# Patient Record
Sex: Male | Born: 1950 | Race: Black or African American | Hispanic: No | Marital: Married | Smoking: Current every day smoker
Health system: Southern US, Community
[De-identification: ages and names within clinical notes are randomized; demographics above are authoritative.]

## PROBLEM LIST (undated history)

## (undated) DIAGNOSIS — F1010 Alcohol abuse, uncomplicated: Secondary | ICD-10-CM

## (undated) DIAGNOSIS — M549 Dorsalgia, unspecified: Secondary | ICD-10-CM

## (undated) DIAGNOSIS — Z5900 Homelessness unspecified: Secondary | ICD-10-CM

---

## 2003-08-07 ENCOUNTER — Emergency Department (HOSPITAL_COMMUNITY): Admission: EM | Admit: 2003-08-07 | Discharge: 2003-08-08 | Payer: Self-pay | Admitting: Emergency Medicine

## 2003-09-30 ENCOUNTER — Ambulatory Visit: Payer: Self-pay | Admitting: Family Medicine

## 2003-09-30 ENCOUNTER — Inpatient Hospital Stay (HOSPITAL_COMMUNITY): Admission: EM | Admit: 2003-09-30 | Discharge: 2003-10-02 | Payer: Self-pay | Admitting: Emergency Medicine

## 2004-10-11 ENCOUNTER — Emergency Department (HOSPITAL_COMMUNITY): Admission: EM | Admit: 2004-10-11 | Discharge: 2004-10-12 | Payer: Self-pay | Admitting: Emergency Medicine

## 2005-03-19 ENCOUNTER — Emergency Department (HOSPITAL_COMMUNITY): Admission: EM | Admit: 2005-03-19 | Discharge: 2005-03-19 | Payer: Self-pay | Admitting: Emergency Medicine

## 2005-04-05 ENCOUNTER — Emergency Department (HOSPITAL_COMMUNITY): Admission: EM | Admit: 2005-04-05 | Discharge: 2005-04-06 | Payer: Self-pay | Admitting: Emergency Medicine

## 2005-12-12 ENCOUNTER — Emergency Department (HOSPITAL_COMMUNITY): Admission: EM | Admit: 2005-12-12 | Discharge: 2005-12-13 | Payer: Self-pay | Admitting: Emergency Medicine

## 2006-02-10 ENCOUNTER — Emergency Department (HOSPITAL_COMMUNITY): Admission: EM | Admit: 2006-02-10 | Discharge: 2006-02-10 | Payer: Self-pay | Admitting: Emergency Medicine

## 2006-05-28 ENCOUNTER — Emergency Department (HOSPITAL_COMMUNITY): Admission: EM | Admit: 2006-05-28 | Discharge: 2006-05-29 | Payer: Self-pay | Admitting: Emergency Medicine

## 2006-07-22 ENCOUNTER — Emergency Department (HOSPITAL_COMMUNITY): Admission: EM | Admit: 2006-07-22 | Discharge: 2006-07-22 | Payer: Self-pay | Admitting: Emergency Medicine

## 2006-08-20 ENCOUNTER — Emergency Department (HOSPITAL_COMMUNITY): Admission: EM | Admit: 2006-08-20 | Discharge: 2006-08-21 | Payer: Self-pay | Admitting: Emergency Medicine

## 2007-05-05 ENCOUNTER — Emergency Department (HOSPITAL_COMMUNITY): Admission: EM | Admit: 2007-05-05 | Discharge: 2007-05-06 | Payer: Self-pay | Admitting: Emergency Medicine

## 2007-12-26 ENCOUNTER — Emergency Department (HOSPITAL_COMMUNITY): Admission: EM | Admit: 2007-12-26 | Discharge: 2007-12-26 | Payer: Self-pay | Admitting: Emergency Medicine

## 2008-04-06 ENCOUNTER — Emergency Department (HOSPITAL_COMMUNITY): Admission: EM | Admit: 2008-04-06 | Discharge: 2008-04-07 | Payer: Self-pay | Admitting: Emergency Medicine

## 2010-03-26 ENCOUNTER — Emergency Department (HOSPITAL_COMMUNITY)
Admission: EM | Admit: 2010-03-26 | Discharge: 2010-03-27 | Disposition: A | Discharge status: Home / Self Care | Payer: Self-pay | Attending: Emergency Medicine | Admitting: Emergency Medicine

## 2010-03-26 DIAGNOSIS — R4789 Other speech disturbances: Secondary | ICD-10-CM | POA: Insufficient documentation

## 2010-03-26 DIAGNOSIS — IMO0002 Reserved for concepts with insufficient information to code with codable children: Secondary | ICD-10-CM | POA: Insufficient documentation

## 2010-03-26 DIAGNOSIS — F1010 Alcohol abuse, uncomplicated: Secondary | ICD-10-CM | POA: Insufficient documentation

## 2010-04-17 LAB — POCT I-STAT, CHEM 8
BUN: 17 mg/dL (ref 6–23)
Calcium, Ion: 1.07 mmol/L — ABNORMAL LOW (ref 1.12–1.32)
Chloride: 104 mEq/L (ref 96–112)
Creatinine, Ser: 1.2 mg/dL (ref 0.4–1.5)
Glucose, Bld: 77 mg/dL (ref 70–99)
HCT: 42 % (ref 39.0–52.0)
Hemoglobin: 14.3 g/dL (ref 13.0–17.0)
Potassium: 3.6 mEq/L (ref 3.5–5.1)
Sodium: 137 mEq/L (ref 135–145)
TCO2: 23 mmol/L (ref 0–100)

## 2010-04-17 LAB — URINALYSIS, ROUTINE W REFLEX MICROSCOPIC
Bilirubin Urine: NEGATIVE
Glucose, UA: NEGATIVE mg/dL
Hgb urine dipstick: NEGATIVE
Ketones, ur: NEGATIVE mg/dL
Nitrite: NEGATIVE
Protein, ur: NEGATIVE mg/dL
Specific Gravity, Urine: 1.009 (ref 1.005–1.030)
Urobilinogen, UA: 1 mg/dL (ref 0.0–1.0)
pH: 6 (ref 5.0–8.0)

## 2010-04-17 LAB — GLUCOSE, CAPILLARY: Glucose-Capillary: 84 mg/dL (ref 70–99)

## 2010-05-24 NOTE — Discharge Summary (Signed)
NAMEARSEN, MANGIONE NO.:  [id number]   MEDICAL RECORD NO.:  [id number]          PATIENT TYPE:  INP   LOCATION:  3004                         FACILITY:  [facility]   PHYSICIAN:  Madeleine B. Erenest Rasher, M.D.DATE OF BIRTH:  30-Sep-1950   DATE OF ADMISSION:  09/29/2003  DATE OF DISCHARGE:  10/02/2003                                 DISCHARGE SUMMARY   PRIMARY CARE PHYSICIAN:  Unassigned.   DISCHARGE DIAGNOSES:  1.  Polysubstance abuse.  2.  Left arm weakness.  3.  Anemia.  4.  Tobacco abuse.   DISCHARGE MEDICATIONS:  1.  Aspirin 325 mg p.o. daily.  2.  Nicotine patch 14 mg daily.   HOSPITAL COURSE:  #1 -  LEFT-SIDED WEAKNESS:  The patient is a 60 year old  African-American male who came to our emergency department by EMS when he  was found to be extremely intoxicated and left-sided weakness.  The patient  unable to give an exact onset of left-sided weakness, possibly a year,  possibly a week with increasing symptoms over the last few days.  On  admission, the patient had 3+/5 muscular strength on the left side, mild  facial droop, decreased grip strength.  On discharge, he has 4+/5 left-sided  weakness, facial droop resolved, decreased cerebellar function on the left  as demonstrated by finger-to-nose on examination.  The patient received  carotid Doppler's which showed no hemodynamically significant ICA stenosis  with mild heterogenous plaques bilaterally noted.  On admission, neuro was  consulted over the phone who stated stroke symptoms were chronicologically  outside of the stroke team window.  They recommended an aspirin.  CT scan of  the head showed no acute changes, no bleeds.  A physical therapy consult was  ordered.  Fasting lipid panel was ordered to assess the patient's risk.  Cholesterol was 186, HDL 33, LDL 124.  #2 -  POLYSUBSTANCE ABUSE:  The patient was intoxicated with an ETOH of 190  on admission.  Urine drug screen negative for cocaine, opiates,  benzo's, or  THC.  The patient was provided with counseling and information on drinking  cessation and substance abuse.  #3 -  ANEMIA:  The patient's B12 was 727 and within normal limits, folate  pending on discharge.  HIV negative.   CONDITION ON DISCHARGE:  Fair.   DISCHARGE DISPOSITION:  To home.   DISCHARGE INSTRUCTIONS:  Stop smoking and drinking.  Follow up with [facility], number provided.       MBV/MEDQ  D:  10/02/2003  T:  10/02/2003  Job:  [id number]

## 2010-05-24 NOTE — H&P (Signed)
Albert Bond, Albert Bond NO.:  [id number]   MEDICAL RECORD NO.:  [id number]          PATIENT TYPE:  INP   LOCATION:  1823                         FACILITY:  [facility]   PHYSICIAN:  Pearlean Brownie, M.D.DATE OF BIRTH:  [birth year]   DATE OF ADMISSION:  09/29/2003  DATE OF DISCHARGE:                                HISTORY & PHYSICAL   CHIEF COMPLAINT:  Left arm weakness and altered mental status with  intoxication.   HISTORY OF PRESENT ILLNESS:  This 60 year old African-American male with  alcohol abuse/dependence, who was here with left arm weakness per ED doctor  since 8:30 September 23 per patient weakness x 1 week.  History hard to  elicit secondary to patient's intoxications, questionable history of fall a  week ago, no rehab.   REVIEW OF SYSTEMS:  Negative for chest pain, headache, shortness of breath,  nausea, vomiting, diarrhea, constipation.  No blood in stool.  No fevers or  chills.   PAST MEDICAL HISTORY:  The patient denies any Past Medical History or  hospitalizations.  No  primary physician, no current medications, no  allergies.   FAMILY HISTORY:  Noncontributory.  The patient states parents died of old  age.   SOCIAL HISTORY:  Positive for ETOH.  The patient drinks all night long.  Per ED doctor, one-fifth Christiane Ha q.p.m.  Positive for tobacco, one pack  per day for several years.  Positive for crack use three days ago.  Lives at  [facility] with others.   PHYSICAL EXAMINATION:  VITAL SIGNS:  Temperature 97.8, heart rate 82,  respiratory rate 16, blood pressure 113/75, 97% on room air.  GENERAL:  The patient is disheveled.  Poor hygiene.  Malodorous, intoxicated  male in no acute distress.  HEENT:  Left brow has healed laceration.  Positive scleral icterus and  bilateral nystagmus.  Pupils equal, round, and reactive to light.  Questionable glaucoma.  Questionable left facial droop.  CARDIOVASCULAR;  Regular rate and rhythm.  Brisk  capillary refill, 2+  peripheral pulses.  RESPIRATORY:  Hard to examine secondary to intoxicated patient, but clear to  auscultation.  No wheezes appreciated.  Positive clubbing.  No cyanosis.  ABDOMEN:  Soft, nontender, nondistended.  Positive bowel sounds.  No liver  border palpated.  No organomegaly.  NEUROLOGIC:  Alert and oriented x 3, but patient clearly intoxicated.  Musculoskeletal left-side weakness in upper and lower extremities, 3+  muscular strength in upper and lower extremities, 4+ on the right.  SKIN:  No lesions appreciated.   LABORATORY AND X-RAY DATA:  White count 5.8, hemoglobin 10.5, platelets 140.  PT 13.0, INR 1.0, PTT 29.  UA negative.  Urine drug screen negative.  Alcohol level 190.  Hepatic panel negative.  AST 33, ALT 23, alkaline  phosphatase 61, total bilirubin 0.4, creatinine 1.1, direct bilirubin less  than 0.1.   IMPRESSION AND PLAN:  This is a 60 year old African-American male.   1.  Alcoholism.  The patient was intoxicated on admission.  The patient      reports drinking a liter q.p.m., questionable fall history.  Liver  function tests within normal limits.  PT/INR within normal limits.  No      palpable liver border.  Will admit patient and give multivitamins with      folate and thiamin.  DT precautions and Ativan protocol.  Will get      polysubstance abuse consult.  2.  Left-sided weakness.  The patient states weakness for one week,      questionable history of fall versus cerebrovascular accident versus      ethanol neuropathy versus Saturday night neuropathy, but head CT is      within normal limits.  Weakness is unilateral with 3+ muscular strength      on the left and left facial droop.  Unclear how long symptoms have      persisted, but definitely greater than 12 hours.  Per ED physician,      neurology was contacted, and the time frame for stroke intervention had      expired.  Thus, the recommendation was to give an aspirin 325 mg p.o.       daily.  We will reevaluate this week when patient is not intoxicated and      can cooperate more fully with physical exam.  3.  Disposition.  Care management consult ordered.       MBV/MEDQ  D:  09/30/2003  T:  09/30/2003  Job:  [id number]

## 2010-07-26 ENCOUNTER — Emergency Department (HOSPITAL_COMMUNITY)
Admission: EM | Admit: 2010-07-26 | Discharge: 2010-07-27 | Disposition: A | Discharge status: Home / Self Care | Payer: Self-pay | Attending: Emergency Medicine | Admitting: Emergency Medicine

## 2010-07-26 DIAGNOSIS — F1010 Alcohol abuse, uncomplicated: Secondary | ICD-10-CM | POA: Insufficient documentation

## 2010-07-26 LAB — GLUCOSE, CAPILLARY: Glucose-Capillary: 125 mg/dL — ABNORMAL HIGH (ref 70–99)

## 2010-10-01 LAB — ETHANOL
Alcohol, Ethyl (B): 137 — ABNORMAL HIGH
Alcohol, Ethyl (B): 324 — ABNORMAL HIGH

## 2010-10-21 LAB — ETHANOL: Alcohol, Ethyl (B): 277 — ABNORMAL HIGH

## 2010-10-21 LAB — BASIC METABOLIC PANEL
BUN: 5 — ABNORMAL LOW
CO2: 26
Calcium: 8.4
Chloride: 104
Creatinine, Ser: 0.79
GFR calc Af Amer: >60
GFR calc non Af Amer: >60
Glucose, Bld: 83
Potassium: 3.7
Sodium: 139

## 2013-06-27 ENCOUNTER — Emergency Department (HOSPITAL_COMMUNITY)
Admission: EM | Admit: 2013-06-27 | Discharge: 2013-06-27 | Disposition: A | Discharge status: Home / Self Care | Payer: Self-pay | Attending: Emergency Medicine | Admitting: Emergency Medicine

## 2013-06-27 ENCOUNTER — Encounter (HOSPITAL_COMMUNITY): Payer: Self-pay | Admitting: Emergency Medicine

## 2013-06-27 DIAGNOSIS — F1010 Alcohol abuse, uncomplicated: Secondary | ICD-10-CM | POA: Insufficient documentation

## 2013-06-27 DIAGNOSIS — F10920 Alcohol use, unspecified with intoxication, uncomplicated: Secondary | ICD-10-CM

## 2013-06-27 LAB — CBG MONITORING, ED: Glucose-Capillary: 91 mg/dL (ref 70–99)

## 2013-06-27 NOTE — Discharge Instructions (Signed)
Alcohol Intoxication °Alcohol intoxication occurs when the amount of alcohol that a person has consumed impairs his or her ability to mentally and physically function. Alcohol directly impairs the normal chemical activity of the brain. Drinking large amounts of alcohol can lead to changes in mental function and behavior, and it can cause many physical effects that can be harmful.  °Alcohol intoxication can range in severity from mild to very severe. Various factors can affect the level of intoxication that occurs, such as the person's age, gender, weight, frequency of alcohol consumption, and the presence of other medical conditions (such as diabetes, seizures, or heart conditions). Dangerous levels of alcohol intoxication may occur when people drink large amounts of alcohol in a short period (binge drinking). Alcohol can also be especially dangerous when combined with certain prescription medicines or "recreational" drugs. °SIGNS AND SYMPTOMS °Some common signs and symptoms of mild alcohol intoxication include: °· Loss of coordination. °· Changes in mood and behavior. °· Impaired judgment. °· Slurred speech. °As alcohol intoxication progresses to more severe levels, other signs and symptoms will appear. These may include: °· Vomiting. °· Confusion and impaired memory. °· Slowed breathing. °· Seizures. °· Loss of consciousness. °DIAGNOSIS  °Your health care provider will take a medical history and perform a physical exam. You will be asked about the amount and type of alcohol you have consumed. Blood tests will be done to measure the concentration of alcohol in your blood. In many places, your blood alcohol level must be lower than 80 mg/dL (0.08%) to legally drive. However, many dangerous effects of alcohol can occur at much lower levels.  °TREATMENT  °People with alcohol intoxication often do not require treatment. Most of the effects of alcohol intoxication are temporary, and they go away as the alcohol naturally  leaves the body. Your health care provider will monitor your condition until you are stable enough to go home. Fluids are sometimes given through an IV access tube to help prevent dehydration.  °HOME CARE INSTRUCTIONS °· Do not drive after drinking alcohol. °· Stay hydrated. Drink enough water and fluids to keep your urine clear or pale yellow. Avoid caffeine.   °· Only take over-the-counter or prescription medicines as directed by your health care provider.   °SEEK MEDICAL CARE IF:  °· You have persistent vomiting.   °· You do not feel better after a few days. °· You have frequent alcohol intoxication. Your health care provider can help determine if you should see a substance use treatment counselor. °SEEK IMMEDIATE MEDICAL CARE IF:  °· You become shaky or tremble when you try to stop drinking.   °· You shake uncontrollably (seizure).   °· You throw up (vomit) blood. This may be bright red or may look like black coffee grounds.   °· You have blood in your stool. This may be bright red or may appear as a black, tarry, bad smelling stool.   °· You become lightheaded or faint.   °MAKE SURE YOU:  °· Understand these instructions. °· Will watch your condition. °· Will get help right away if you are not doing well or get worse. °Document Released: 10/02/2004 Document Revised: 08/25/2012 Document Reviewed: 05/28/2012 °ExitCare® Patient Information ©2015 [employer]. This information is not intended to replace advice given to you by your health care provider. Make sure you discuss any questions you have with your health care provider. ° ° °Emergency Department Resource Guide °1) Find a Doctor and Pay Out of Pocket °Although you won't have to find out who is covered by your   insurance plan, it is a good idea to ask around and get recommendations. You will then need to call the office and see if the doctor you have chosen will accept you as a new patient and what types of options they offer for patients who are self-pay.  Some doctors offer discounts or will set up payment plans for their patients who do not have insurance, but you will need to ask so you aren't surprised when you get to your appointment. ° °2) Contact Your Local Health Department °Not all health departments have doctors that can see patients for sick visits, but many do, so it is worth a call to see if yours does. If you don't know where your local health department is, you can check in your phone book. The CDC also has a tool to help you locate your state's health department, and many state websites also have listings of all of their local health departments. ° °3) Find a Walk-in Clinic °If your illness is not likely to be very severe or complicated, you may want to try a walk in clinic. These are popping up all over the country in pharmacies, drugstores, and shopping centers. They're usually staffed by nurse practitioners or physician assistants that have been trained to treat common illnesses and complaints. They're usually fairly quick and inexpensive. However, if you have serious medical issues or chronic medical problems, these are probably not your best option. ° °No Primary Care Doctor: °- Call [facility] at  832-8000 - they can help you locate a primary care doctor that  accepts your insurance, provides certain services, etc. °- Physician Referral Service- 1-800-533-3463 ° °Chronic Pain Problems: °Organization         Address  Phone   Notes  °[facility]  (336) 297-2271 Patients need to be referred by their primary care doctor.  ° °Medication Assistance: °Organization         Address  Phone   Notes  °[place] [facility] [address] °[place], [place] [address] (336) 641-8030 --Must be a resident of [place] °-- Must have NO insurance coverage whatsoever (no Medicaid/ Medicare, etc.) °-- The pt. MUST have a primary care doctor that directs their care regularly and follows them in the  community °  °[facility]  (866) 331-1348   °[employer]  (888) 892-1162   ° °Agencies that provide inexpensive medical care: °Organization         Address  Phone   Notes  °[facility]  (336) 832-8035   °[facility]    (336) 832-7272   °[facility] [address] °[place], [place] [address] (336) 832-4777   °[facility] [address], °[place] (336) 271-4999   °[facility]    (336) 373-0678   °[facility]    (336) 272-1050   °[facility] ° [address], [place] Phone:  (336) 832-4444, Fax:  (336) 832-4440 Hours of Operation:  9 am - 6 pm, M-F.  Also accepts Medicaid/Medicare and self-pay.  °[facility] ° [address], [place] Phone: (336) 832-3150, Fax: (336) 832-3151. Hours of Operation:  8:30 am - 5:30 pm, M-F.  Also accepts Medicaid and self-pay.  °[facility] [place] [address], [place] Phone: (336) 878-6027   °[facility] [address], [place], [place] (336)723-1848, Ext. 123 Mondays &   Thursdays: 7-9 AM.  First 15 patients are seen on a first come, first serve basis. °  ° °Medicaid-accepting [place] Providers: ° °Organization         Address  Phone   Notes  °[facility] [address], [place] (336) 641-2100 Also accepts self-pay patients.  °[facility] [address], [place] ° (336) 856-9996   °[facility] [address], [place] (336) 288-8857   °[facility] [address] [place] [address], [place] (336) 299-7000   °Veita Bland [address], [place]  ° (336) 373-1557 Only accepts [facility] Access Medicaid patients after they have their name applied to their card.  ° °Self-Pay (no insurance) in [place]: ° °Organization         Address  Phone   Notes  °[facility] [address], [place] (336) 832-1970   °[facility] [address], [place] (336) 832-4400   ° [facility] ° [address], [place] (336) 992-4800   °[facility]/Dr. Osei-Bonsu ° [address] [place] [address], [place] or [address], [place] (336) 841-8500 Phone number for both [place] and [place] locations is the same.  °[facility] [address], [place] (336) 299-0000   °[facility] [address] [place] [address], [place] or [address] (336) 852-7530 °(336) 878-2260   °[facility] [address], [place] (336) 350-1642, phone; (336) 294-5005, fax Sees patients 1st and 3rd Saturday of every month.  Must not qualify for public or private insurance (i.e. Medicaid, Medicare, [place] Health Choice, Veterans' Benefits) • Household income should be no more than 200% of the poverty level •The clinic cannot treat you if you are pregnant or think you are pregnant • Sexually transmitted diseases are not treated at the clinic.  ° ° °Dental Care: °Organization         Address  Phone  Notes  °[facility] [address], [place] (336) 641-6152 Accepts children up to age 21 who are enrolled in Medicaid or [facility] Health Choice; pregnant women with a Medicaid card; and children who have applied for Medicaid or [place] Health Choice, but were declined, whose parents can pay a reduced fee at time of service.  °[facility] [place]  [address], [place] (336) 641-7733 Accepts children up to age 21 who are enrolled in Medicaid or [place] Health Choice; pregnant women with a Medicaid card; and children who have applied for Medicaid or [place] Health Choice, but were declined, whose parents can pay a reduced fee at time of service.  °[facility] ° [address],  (336) 641-4533 Patients are seen by appointment only. Walk-ins are not accepted. [facility] will see patients 18 years of age and older. °Monday - Tuesday (8am-5pm) °Most Wednesdays (8:30-5pm) °$30 per visit, cash only  °[facility] ° [address], [place] (336) 641-4533 Patients are seen by appointment only. Walk-ins are not accepted. [facility] will see patients 18 years of age and older. °One Wednesday Evening (Monthly: Volunteer Based).  $30 per visit, cash only  °[facility]  (919) 537-3737 for adults; Children under   age 4, call Graduate Pediatric Dentistry at (919) [phone]. Children aged 4-14, please call (919) [phone] to request a pediatric application. ° Dental services are provided in all areas of dental care including fillings, crowns and bridges, complete and partial dentures, implants, gum treatment, root canals, and extractions. Preventive care is also provided. Treatment is provided to both adults and children. °Patients are selected via a lottery and there is often a waiting list. °  °[facility] [address], °[place] ° (336) 763-8833 [email or web address] °  °[facility] [address], [place], [place] (336)723-1848, Ext. 123 Second and Fourth Thursday of each month, opens at 6:30 AM; Clinic ends at 9 AM.  Patients are seen on a first-come first-served basis, and a limited number are seen during each clinic.  ° °[facility] ° [address], [place], [place] (336) 723-7904   Eligibility Requirements °You must have lived in [place], [place], or [place] counties for at least the last three months. °  You cannot be eligible for state or federal sponsored healthcare insurance, including Veterans Administration, Medicaid, or Medicare. °  You generally cannot be eligible for healthcare insurance through your employer.  °  How to apply: °Eligibility screenings are held every  Tuesday and Wednesday afternoon from 1:00 pm until 4:00 pm. You do not need an appointment for the interview!  °[facility] [address], [place], [place] 336-631-2330   °[facility]  336-342-8273   °[facility]  336-703-3100   °[facility]  336-570-6415   ° °Behavioral Health Resources in the Community: °Intensive Outpatient Programs °Organization         Address  Phone  Notes  °[facility] [address], [place], [place] 336-878-6098   °[facility] [address], [place], [place] 336-832-9800   °[facility] [address], [place], [place] ° 336-882-2125   °[facility] [address],  °[place], [place] 1-800-853-5163 or 336-641-4981   °Substance Abuse Resources °Organization         Address  Phone  Notes  °[facility]  336-882-2125   °[facility]  336-784-9470   °[facility]  336-285-9073   °[facility]  336-845-3988   °Residential & Outpatient Substance Abuse Program  1-800-659-3381   °Psychological Services °Organization         Address  Phone  Notes  °[facility]  336- 832-9600   °[facility]  336- 378-7881   °[facility] [address], [place] 1-800-853-5163 or 336-641-4981   ° °Mobile Crisis Teams °Organization         Address  Phone  Notes  °[facility], Mobile Crisis Care Unit  1-877-626-1772   °Assertive °[facility] ° [address] [place], [place] 336-834-9664   °Sharon DeEsch [address] °[place] 336-554-5454   ° °Self-Help/Support Groups °Organization         Address  Phone             Notes  °[facility]  - variety of support groups  336- 373-1402 Call for more information  °Narcotics Anonymous (NA), [facility] [address], °[place]  2 meetings at this location   ° °Residential Treatment Programs °Organization         Address  Phone  Notes  °[facility] [address],    °[place]  1-866-801-8205   °[facility] ° [address], [place], [place] 704-293-8524   °[facility] [address], [place] 336-845-3988 Admissions: 8am-3pm M-F  °[facility] [address]    °[place], [place] 336-841-1104   °[facility] [address], [place], [place] 336-379-7146   °[facility] [address]  °[place], [place] 336-285-9073   °[facility] Intensive Outpatient [address], [place], [place] 336-852-3033   °[facility] [address]  °[place], [place] 1-877-615-2722 or 336-784-9470   °[facility] ([facility] [address] [place], [place] 336-227-7417 Accepts Medicaid  °[facility] [address]  °[place] 1-800-659-3381 Substance Abuse/Addiction Treatment  ° °[place] Behavioral Health Resources °Organization         Address  Phone  Notes  °[facility]  (888) 581-9988   °Julie Brannon, PhD [address] [place], [place]   (336) 349-5553 or (336) 951-0000   °[facility]   [address] °[place], [place] (336) 349-4454   °[facility] [address], [place], [place] (336) 342-8316 Insurance/Medicaid/sponsorship through [facility]  °[facility] [address]                                    [place], [place] (336) 342-8316 Therapy/tele-psych/case  °[facility] [address]  ° [place], [place] (336) 349-2233    °Dr. Arfeen  (336) 349-4544   °[facility] [place]  [employer] [place] [facility] 1) [address],  °2) [address], [place] °3)  [address], [place] (336) 349-3220 °(336) 342-7768 ° °(336) 342-8140   °[place] [facility] (336) 342-1394 or (336) 342-3537 (After  Hours)    ° ° ° °

## 2013-06-27 NOTE — ED Notes (Signed)
[facility] Expected date:  Expected time:  Means of arrival:  Comments: EMS- ETOH UNABLE TO AMBULATE

## 2013-06-27 NOTE — ED Notes (Addendum)
Per EMS pt got citation for sleeping in no trespassing zone, was belligerent with police, too intoxicated with ETOH to walk and so cannot go with police. Pt states he had a small amount of gin and juice, part of a bottle of wine, and one blunt of marijuana today.

## 2013-06-27 NOTE — ED Provider Notes (Signed)
CSN: [id number]     Arrival date & time 06/27/13  1217 History   First MD Initiated Contact with Patient 06/27/13 1253     Chief Complaint  Patient presents with  . Alcohol Intoxication     (Consider location/radiation/quality/duration/timing/severity/associated sxs/prior Treatment) HPI Comments: 63 year old male presenting with acute alcohol intoxication. He states he drank too much this morning. He was drinking gin and juice. He was found asleep in a mattress dressings by police. He was too intoxicated to go with them, so he was brought to the emergency department.  Patient is a 63 y.o. male presenting with intoxication.  Alcohol Intoxication This is a recurrent problem. Episode onset: Today. The problem occurs constantly. The problem has not changed since onset.Associated symptoms comments: No headache, no nausea, no vomiting, no trauma.. Nothing aggravates the symptoms. Nothing relieves the symptoms.    History reviewed. No pertinent past medical history. History reviewed. No pertinent past surgical history. No family history on file. History  Substance Use Topics  . Smoking status: Not on file  . Smokeless tobacco: Not on file  . Alcohol Use: Yes     Comment: Heavily    Review of Systems  All other systems reviewed and are negative.     Allergies  Review of patient's allergies indicates no known allergies.  Home Medications   Prior to Admission medications   Not on File   BP 139/87  Pulse 71  Temp(Src) 97.8 F (36.6 C) (Oral)  Resp 18  SpO2 100% Physical Exam  Nursing note and vitals reviewed. Constitutional: He is oriented to person, place, and time. He appears well-developed and well-nourished. No distress.  HENT:  Head: Normocephalic and atraumatic.  Eyes: Conjunctivae are normal. No scleral icterus.  Neck: Neck supple.  Cardiovascular: Normal rate and intact distal pulses.   Pulmonary/Chest: Effort normal. No stridor. No respiratory distress.   Abdominal: Normal appearance. He exhibits no distension. There is no tenderness. There is no rebound and no guarding.  Neurological: He is alert and oriented to person, place, and time.  Skin: Skin is warm and dry. No rash noted.  Psychiatric: He has a normal mood and affect. His behavior is normal.    ED Course  Procedures (including critical care time) Labs Review Labs Reviewed  CBG MONITORING, ED    Imaging Review No results found.   EKG Interpretation None      MDM   Final diagnoses:  Acute alcohol intoxication, uncomplicated    63 year old male presenting with acute alcohol intoxication. Sleeping comfortably now. Vitals stable, nontoxic appearance. He endorses drinking gin. He denies trauma. He has no other acute complaints.  16:00 - patient still unable to ambulate secondary to his alcohol intoxication.    Candyce ChurnJohn David Dovey Fatzinger III, MD 06/30/13 [phone]

## 2013-06-27 NOTE — ED Provider Notes (Signed)
6:52 PM Pt ambulatory. Dc home.   Lyanne CoKevin M Pavlos Yon, MD 06/27/13 [phone]

## 2013-08-24 ENCOUNTER — Emergency Department (HOSPITAL_COMMUNITY): Payer: Medicaid Other

## 2013-08-24 ENCOUNTER — Inpatient Hospital Stay (HOSPITAL_COMMUNITY)
Admission: EM | Admit: 2013-08-24 | Discharge: 2013-09-02 | DRG: 473 | Disposition: A | Discharge status: Home Health | Payer: Medicaid Other | Attending: Neurosurgery | Admitting: Neurosurgery

## 2013-08-24 ENCOUNTER — Inpatient Hospital Stay (HOSPITAL_COMMUNITY): Payer: Medicaid Other

## 2013-08-24 ENCOUNTER — Encounter (HOSPITAL_COMMUNITY): Payer: Self-pay | Admitting: Emergency Medicine

## 2013-08-24 DIAGNOSIS — M4712 Other spondylosis with myelopathy, cervical region: Secondary | ICD-10-CM | POA: Diagnosis present

## 2013-08-24 DIAGNOSIS — Z8249 Family history of ischemic heart disease and other diseases of the circulatory system: Secondary | ICD-10-CM | POA: Diagnosis not present

## 2013-08-24 DIAGNOSIS — D696 Thrombocytopenia, unspecified: Secondary | ICD-10-CM | POA: Diagnosis present

## 2013-08-24 DIAGNOSIS — F17200 Nicotine dependence, unspecified, uncomplicated: Secondary | ICD-10-CM | POA: Diagnosis present

## 2013-08-24 DIAGNOSIS — Z8673 Personal history of transient ischemic attack (TIA), and cerebral infarction without residual deficits: Secondary | ICD-10-CM | POA: Diagnosis not present

## 2013-08-24 DIAGNOSIS — Z23 Encounter for immunization: Secondary | ICD-10-CM

## 2013-08-24 DIAGNOSIS — R531 Weakness: Secondary | ICD-10-CM

## 2013-08-24 DIAGNOSIS — F1010 Alcohol abuse, uncomplicated: Secondary | ICD-10-CM | POA: Diagnosis present

## 2013-08-24 DIAGNOSIS — R1313 Dysphagia, pharyngeal phase: Secondary | ICD-10-CM | POA: Diagnosis present

## 2013-08-24 DIAGNOSIS — F10920 Alcohol use, unspecified with intoxication, uncomplicated: Secondary | ICD-10-CM

## 2013-08-24 DIAGNOSIS — F1210 Cannabis abuse, uncomplicated: Secondary | ICD-10-CM | POA: Diagnosis present

## 2013-08-24 DIAGNOSIS — D649 Anemia, unspecified: Secondary | ICD-10-CM | POA: Diagnosis present

## 2013-08-24 DIAGNOSIS — M4802 Spinal stenosis, cervical region: Secondary | ICD-10-CM | POA: Diagnosis present

## 2013-08-24 DIAGNOSIS — W19XXXA Unspecified fall, initial encounter: Secondary | ICD-10-CM | POA: Diagnosis present

## 2013-08-24 DIAGNOSIS — M6281 Muscle weakness (generalized): Secondary | ICD-10-CM

## 2013-08-24 DIAGNOSIS — R55 Syncope and collapse: Secondary | ICD-10-CM

## 2013-08-24 HISTORY — DX: Dorsalgia, unspecified: M54.9

## 2013-08-24 LAB — COMPREHENSIVE METABOLIC PANEL
ALT: 8 U/L (ref 0–53)
ANION GAP: 17 — AB (ref 5–15)
AST: 23 U/L (ref 0–37)
Albumin: 4.2 g/dL (ref 3.5–5.2)
Alkaline Phosphatase: 57 U/L (ref 39–117)
BUN: 10 mg/dL (ref 6–23)
CALCIUM: 9.4 mg/dL (ref 8.4–10.5)
CO2: 23 meq/L (ref 19–32)
CREATININE: 0.87 mg/dL (ref 0.50–1.35)
Chloride: 100 mEq/L (ref 96–112)
GFR calc Af Amer: >90 mL/min (ref >90)
GFR calc non Af Amer: >90 mL/min (ref >90)
GLUCOSE: 98 mg/dL (ref 70–99)
Potassium: 4.4 mEq/L (ref 3.7–5.3)
Sodium: 140 mEq/L (ref 137–147)
Total Bilirubin: 0.5 mg/dL (ref 0.3–1.2)
Total Protein: 7.9 g/dL (ref 6.0–8.3)

## 2013-08-24 LAB — RAPID URINE DRUG SCREEN, HOSP PERFORMED
Amphetamines: NOT DETECTED
Barbiturates: NOT DETECTED
Benzodiazepines: NOT DETECTED
Cocaine: NOT DETECTED
OPIATES: NOT DETECTED
Tetrahydrocannabinol: NOT DETECTED

## 2013-08-24 LAB — CBC
HCT: 35.5 % — ABNORMAL LOW (ref 39.0–52.0)
HEMOGLOBIN: 12.1 g/dL — AB (ref 13.0–17.0)
MCH: 29.7 pg (ref 26.0–34.0)
MCHC: 34.1 g/dL (ref 30.0–36.0)
MCV: 87 fL (ref 78.0–100.0)
Platelets: 106 10*3/uL — ABNORMAL LOW (ref 150–400)
RBC: 4.08 MIL/uL — ABNORMAL LOW (ref 4.22–5.81)
RDW: 14.5 % (ref 11.5–15.5)
WBC: 5.4 10*3/uL (ref 4.0–10.5)

## 2013-08-24 LAB — APTT: APTT: 26 s (ref 24–37)

## 2013-08-24 LAB — TROPONIN I
Troponin I: <0.3 ng/mL (ref <0.30)
Troponin I: <0.3 ng/mL (ref <0.30)

## 2013-08-24 LAB — URINALYSIS, ROUTINE W REFLEX MICROSCOPIC
BILIRUBIN URINE: NEGATIVE
Glucose, UA: NEGATIVE mg/dL
Hgb urine dipstick: NEGATIVE
KETONES UR: NEGATIVE mg/dL
Leukocytes, UA: NEGATIVE
NITRITE: NEGATIVE
Protein, ur: NEGATIVE mg/dL
Specific Gravity, Urine: 1.004 — ABNORMAL LOW (ref 1.005–1.030)
UROBILINOGEN UA: 0.2 mg/dL (ref 0.0–1.0)
pH: 6 (ref 5.0–8.0)

## 2013-08-24 LAB — CBC WITH DIFFERENTIAL/PLATELET
BASOS ABS: 0 10*3/uL (ref 0.0–0.1)
Basophils Relative: 0 % (ref 0–1)
EOS PCT: 1 % (ref 0–5)
Eosinophils Absolute: 0 10*3/uL (ref 0.0–0.7)
HEMATOCRIT: 36.2 % — AB (ref 39.0–52.0)
HEMOGLOBIN: 12.3 g/dL — AB (ref 13.0–17.0)
LYMPHS ABS: 2 10*3/uL (ref 0.7–4.0)
LYMPHS PCT: 35 % (ref 12–46)
MCH: 30.3 pg (ref 26.0–34.0)
MCHC: 34 g/dL (ref 30.0–36.0)
MCV: 89.2 fL (ref 78.0–100.0)
MONO ABS: 0.3 10*3/uL (ref 0.1–1.0)
MONOS PCT: 5 % (ref 3–12)
Neutro Abs: 3.3 10*3/uL (ref 1.7–7.7)
Neutrophils Relative %: 59 % (ref 43–77)
Platelets: 105 10*3/uL — ABNORMAL LOW (ref 150–400)
RBC: 4.06 MIL/uL — ABNORMAL LOW (ref 4.22–5.81)
RDW: 14.4 % (ref 11.5–15.5)
WBC: 5.6 10*3/uL (ref 4.0–10.5)

## 2013-08-24 LAB — CREATININE, SERUM
Creatinine, Ser: 0.84 mg/dL (ref 0.50–1.35)
GFR calc Af Amer: >90 mL/min (ref >90)

## 2013-08-24 LAB — I-STAT TROPONIN, ED: TROPONIN I, POC: 0 ng/mL (ref 0.00–0.08)

## 2013-08-24 LAB — PROTIME-INR
INR: 1.03 (ref 0.00–1.49)
Prothrombin Time: 13.5 seconds (ref 11.6–15.2)

## 2013-08-24 LAB — I-STAT CHEM 8, ED
BUN: 8 mg/dL (ref 6–23)
CHLORIDE: 101 meq/L (ref 96–112)
Calcium, Ion: 1.09 mmol/L — ABNORMAL LOW (ref 1.13–1.30)
Creatinine, Ser: 1.2 mg/dL (ref 0.50–1.35)
GLUCOSE: 99 mg/dL (ref 70–99)
HEMATOCRIT: 42 % (ref 39.0–52.0)
Hemoglobin: 14.3 g/dL (ref 13.0–17.0)
POTASSIUM: 4.2 meq/L (ref 3.7–5.3)
Sodium: 139 mEq/L (ref 137–147)
TCO2: 24 mmol/L (ref 0–100)

## 2013-08-24 LAB — ETHANOL: ALCOHOL ETHYL (B): 213 mg/dL — AB (ref 0–11)

## 2013-08-24 LAB — CK: CK TOTAL: 392 U/L — AB (ref 7–232)

## 2013-08-24 MED ORDER — DEXAMETHASONE SODIUM PHOSPHATE 10 MG/ML IJ SOLN
10.0000 mg | Freq: Once | INTRAMUSCULAR | Status: AC
  Administered 2013-08-24: 10 mg via INTRAVENOUS
  Filled 2013-08-24: qty 1

## 2013-08-24 MED ORDER — SODIUM CHLORIDE 0.9 % IJ SOLN
3.0000 mL | INTRAMUSCULAR | Status: DC | PRN
  Administered 2013-08-27: 3 mL via INTRAVENOUS

## 2013-08-24 MED ORDER — ACETAMINOPHEN 650 MG RE SUPP: 650.0000 mg | Freq: Four times a day (QID) | RECTAL | Status: DC | PRN

## 2013-08-24 MED ORDER — ADULT MULTIVITAMIN W/MINERALS CH
1.0000 | ORAL_TABLET | Freq: Every day | ORAL | Status: DC
  Administered 2013-08-25 – 2013-09-02 (×8): 1 via ORAL
  Filled 2013-08-24 (×10): qty 1

## 2013-08-24 MED ORDER — LORAZEPAM 1 MG PO TABS
0.0000 mg | ORAL_TABLET | Freq: Two times a day (BID) | ORAL | Status: AC
  Administered 2013-08-26: 1 mg via ORAL
  Filled 2013-08-24: qty 1

## 2013-08-24 MED ORDER — SODIUM CHLORIDE 0.9 % IV SOLN
250.0000 mL | INTRAVENOUS | Status: DC | PRN
  Administered 2013-08-31: 250 mL via INTRAVENOUS

## 2013-08-24 MED ORDER — ENOXAPARIN SODIUM 40 MG/0.4ML ~~LOC~~ SOLN
40.0000 mg | SUBCUTANEOUS | Status: DC
  Administered 2013-08-25: 40 mg via SUBCUTANEOUS
  Filled 2013-08-24 (×2): qty 0.4

## 2013-08-24 MED ORDER — SODIUM CHLORIDE 0.9 % IJ SOLN
3.0000 mL | Freq: Two times a day (BID) | INTRAMUSCULAR | Status: DC
  Administered 2013-08-25: 3 mL via INTRAVENOUS

## 2013-08-24 MED ORDER — PNEUMOCOCCAL VAC POLYVALENT 25 MCG/0.5ML IJ INJ
0.5000 mL | INJECTION | INTRAMUSCULAR | Status: DC
Start: 2013-08-25 — End: 2013-08-26
  Filled 2013-08-24: qty 0.5

## 2013-08-24 MED ORDER — SODIUM CHLORIDE 0.9 % IV SOLN
INTRAVENOUS | Status: AC
  Administered 2013-08-24: 18:00:00 via INTRAVENOUS

## 2013-08-24 MED ORDER — FOLIC ACID 1 MG PO TABS
1.0000 mg | ORAL_TABLET | Freq: Every day | ORAL | Status: DC
  Administered 2013-08-25 – 2013-09-02 (×8): 1 mg via ORAL
  Filled 2013-08-24 (×10): qty 1

## 2013-08-24 MED ORDER — LORAZEPAM 2 MG/ML IJ SOLN: 1.0000 mg | Freq: Four times a day (QID) | INTRAMUSCULAR | Status: AC | PRN

## 2013-08-24 MED ORDER — THIAMINE HCL 100 MG/ML IJ SOLN
100.0000 mg | Freq: Every day | INTRAMUSCULAR | Status: DC
  Filled 2013-08-24 (×2): qty 1

## 2013-08-24 MED ORDER — VITAMIN B-1 100 MG PO TABS
100.0000 mg | ORAL_TABLET | Freq: Every day | ORAL | Status: DC
  Administered 2013-08-25 – 2013-09-02 (×8): 100 mg via ORAL
  Filled 2013-08-24 (×10): qty 1

## 2013-08-24 MED ORDER — ACETAMINOPHEN 325 MG PO TABS
650.0000 mg | ORAL_TABLET | Freq: Four times a day (QID) | ORAL | Status: DC | PRN
  Administered 2013-08-27 – 2013-09-01 (×4): 650 mg via ORAL
  Filled 2013-08-24 (×4): qty 2

## 2013-08-24 MED ORDER — NICOTINE 14 MG/24HR TD PT24
14.0000 mg | MEDICATED_PATCH | Freq: Every day | TRANSDERMAL | Status: DC
  Administered 2013-08-24 – 2013-09-02 (×9): 14 mg via TRANSDERMAL
  Filled 2013-08-24 (×10): qty 1

## 2013-08-24 MED ORDER — SODIUM CHLORIDE 0.9 % IJ SOLN
3.0000 mL | Freq: Two times a day (BID) | INTRAMUSCULAR | Status: DC
  Administered 2013-08-25 – 2013-09-02 (×11): 3 mL via INTRAVENOUS

## 2013-08-24 MED ORDER — LORAZEPAM 1 MG PO TABS
1.0000 mg | ORAL_TABLET | Freq: Four times a day (QID) | ORAL | Status: AC | PRN
  Administered 2013-08-25: 1 mg via ORAL
  Filled 2013-08-24: qty 1

## 2013-08-24 MED ORDER — LORAZEPAM 1 MG PO TABS
0.0000 mg | ORAL_TABLET | Freq: Four times a day (QID) | ORAL | Status: AC
  Administered 2013-08-24: 1 mg via ORAL
  Filled 2013-08-24: qty 1

## 2013-08-24 NOTE — Progress Notes (Signed)
Echo Lab  2D Echocardiogram completed.  Haston Casebolt L Lydiana Milley, RDCS 08/24/2013 10:41 AM

## 2013-08-24 NOTE — Progress Notes (Signed)
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 Per neurosurgery, plan for C3-4, C4-5 ACDF. Will await new consult post surgery.      Alvie HeidelbergFolan, Zackaria Burkey A 08/24/2013, 4:56 PM Alvie HeidelbergShauna Folan, PT, DPT [phone]

## 2013-08-24 NOTE — Care Management Note (Addendum)
  Page 2 of 2   09/02/2013     3:31:01 PM CARE MANAGEMENT NOTE 09/02/2013  Patient:  Albert Bond, Albert Bond   Account Number:  [id number]  Date Initiated:  08/24/2013  Documentation initiated by:  GRAVES-BIGELOW,BRENDA  Subjective/Objective Assessment:   Pt admitted for syncope and L sided weakness. MRI revealed severe canal stenosis and spinal cord compression. Plan for C3-4, C4-5 ACDF.     Action/Plan:   CM will continue to monitor.   Anticipated DC Date:  08/26/2013   Anticipated DC Plan:  [place]  CM consult      Choice offered to / List presented to:  C-1 Patient      DME agency  [facility] arranged  HH-5 SPEECH THERAPY  HH-2 PT      Status of service:  In process, will continue to follow Medicare Important Message given?  NO (If response is "NO", the following Medicare IM given date fields will be blank) Date Medicare IM given:   Medicare IM given by:   Date Additional Medicare IM given:   Additional Medicare IM given by:    Discharge Disposition:  [facility]  Per UR Regulation:  Reviewed for med. necessity/level of care/duration of stay  If discussed at [place] of Stay Meetings, dates discussed:    Comments:  09/02/13 [place], MSN, CM- Met with patient to discuss home health needs. Patient is agreeable to home health and is willing to have CM call to see what agency may be able to help him as a charity case.  Patient has orders for HHPT/SLP.  CM spoke with [name] with [employer] [facility], who states that financial paperwork for a charity case can be started on Monday.  CM spoke with patient's sister Albert Bond regarding [place]. Patient will be discharging home with his sister to [address] [place], [place] [address].  Contact number is [phone].     08-26-13 Rosa Sanchez, RN, BSN [phone] CM did place call to the [place]- received vm. [facility]  information placed on AVS for pt to call for hospital f/u. CM will continue to monitor for additional disposition needs.

## 2013-08-24 NOTE — Progress Notes (Signed)
[facility] PROGRESS NOTE  Albert Bond [facility] ZOX:[id number] DOB: 1950-12-08 DOA: 08/24/2013 PCP: No PCP Per Patient  Assessment/Plan: 1. L sided weakness secondary to severe cervical spine disease 1. Severe disease with spinal cord involvement 2. Neurosurgery following 3. Neurosurg recs noted for surgical decompression and fusion 2. Chronic Anemia of unclear etiology 1. Stable 2. Monitor for now 3. Thrombocytopenia 1. Suspect from ETOH intake (see below) 2. Follow for now 3. Stable thus far 4. Hx ETOH abuse 1. Pt reports drinking "all my life" 2. Last intake was "a fifth" one day prior to admit 3. On CIWA 5. DVT prophylaxis 1. Lovenox subq  Code Status: Full Family Communication: Pt in room, cousin over phone Disposition Plan: Pending   Consultants:  Neurosurgery  Procedures:    Antibiotics:   (indicate start date, and stop date if known)  HPI/Subjective: No complaints. No acute events noted  Objective: Filed Vitals:   08/24/13 0319 08/24/13 0400 08/24/13 0430 08/24/13 0559  BP:  116/77 105/71 149/93  Pulse:  66 71 78  Temp: 97.6 F (36.4 C)   98.2 F (36.8 C)  TempSrc:    Oral  Resp:  12 12 18   Height:    5\' 7"  (1.702 m)  Weight:    64.048 kg (141 lb 3.2 oz)  SpO2:  100% 99% 100%    Intake/Output Summary (Last 24 hours) at 08/24/13 1416 Last data filed at 08/24/13 0855  Gross per 24 hour  Intake    240 ml  Output   1400 ml  Net  -1160 ml   Filed Weights   08/24/13 0559  Weight: 64.048 kg (141 lb 3.2 oz)    Exam:   General:  Awake, in nad  Cardiovascular: regular, s1, s2  Respiratory: normal resp effort, no wheezing  Abdomen: soft, nondistended  Musculoskeletal: perfused,no clubbing   Data Reviewed: Basic Metabolic Panel:  Recent Labs Lab 08/24/13 0101 08/24/13 0110 08/24/13 0632  NA 140 139  --   K 4.4 4.2  --   CL 100 101  --   CO2 23  --   --   GLUCOSE 98 99  --   BUN 10 8  --   CREATININE 0.87 1.20 0.84   CALCIUM 9.4  --   --    Liver Function Tests:  Recent Labs Lab 08/24/13 0101  AST 23  ALT 8  ALKPHOS 57  BILITOT 0.5  PROT 7.9  ALBUMIN 4.2   No results found for this basename: LIPASE, AMYLASE,  in the last 168 hours No results found for this basename: AMMONIA,  in the last 168 hours CBC:  Recent Labs Lab 08/24/13 0101 08/24/13 0110 08/24/13 0632  WBC 5.6  --  5.4  NEUTROABS 3.3  --   --   HGB 12.3* 14.3 12.1*  HCT 36.2* 42.0 35.5*  MCV 89.2  --  87.0  PLT 105*  --  106*   Cardiac Enzymes:  Recent Labs Lab 08/24/13 0101 08/24/13 0751  CKTOTAL 392*  --   TROPONINI <0.30 <0.30   BNP (last 3 results) No results found for this basename: PROBNP,  in the last 8760 hours CBG: No results found for this basename: GLUCAP,  in the last 168 hours  No results found for this or any previous visit (from the past 240 hour(s)).   Studies: Ct Head Wo Contrast  08/24/2013   CLINICAL DATA:  Alcohol intoxication.  Unable to ambulate.  EXAM: CT HEAD WITHOUT CONTRAST  CT CERVICAL  SPINE WITHOUT CONTRAST  TECHNIQUE: Multidetector CT imaging of the head and cervical spine was performed following the standard protocol without intravenous contrast. Multiplanar CT image reconstructions of the cervical spine were also generated.  COMPARISON:  08/07/2003 head CT  FINDINGS: CT HEAD FINDINGS  Skull and Sinuses:Negative for fracture or destructive process. The mastoids, middle ears, and imaged paranasal sinuses are clear.  Orbits: No acute abnormality.  Brain: No evidence of acute abnormality, such as acute infarction, hemorrhage, hydrocephalus, or mass lesion/mass effect. There are new but discrete and likely remote lacunar infarcts involving the right caudate head and right caudate body/ corona radiata.  Brain atrophy which is generalized and progressive from 2005.  CT CERVICAL SPINE FINDINGS  No evidence of acute fracture or traumatic subluxation. No gross cervical canal hematoma or prevertebral  edema.  There is diffuse, advanced degenerative disc narrowing and endplate irregularity. When superimposed on a congenitally narrow spinal canal there is diffuse canal stenosis. Central disc herniation at C3-4 results in severe spinal canal stenosis with cord compression. Uncovertebral spurring is diffuse and bulky, with bilateral foraminal stenosis most progressed at C3-4 and C4-5. Degenerative ankylosis has formed across the C5-6 disc.  IMPRESSION: 1. No acute intracranial or cervical spine injury identified. 2. 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 3. Diffuse cervical canal stenosis, as above. Stenosis is severe at the level of C3-4, with cord compression. 4. Generalized brain atrophy, progressive from 2005.   Electronically Signed   By: Tiburcio PeaJonathan  Watts M.D.   On: 08/24/2013 01:48   Ct Cervical Spine Wo Contrast  08/24/2013   CLINICAL DATA:  Alcohol intoxication.  Unable to ambulate.  EXAM: CT HEAD WITHOUT CONTRAST  CT CERVICAL SPINE WITHOUT CONTRAST  TECHNIQUE: Multidetector CT imaging of the head and cervical spine was performed following the standard protocol without intravenous contrast. Multiplanar CT image reconstructions of the cervical spine were also generated.  COMPARISON:  08/07/2003 head CT  FINDINGS: CT HEAD FINDINGS  Skull and Sinuses:Negative for fracture or destructive process. The mastoids, middle ears, and imaged paranasal sinuses are clear.  Orbits: No acute abnormality.  Brain: No evidence of acute abnormality, such as acute infarction, hemorrhage, hydrocephalus, or mass lesion/mass effect. There are new but discrete and likely remote lacunar infarcts involving the right caudate head and right caudate body/ corona radiata.  Brain atrophy which is generalized and progressive from 2005.  CT CERVICAL SPINE FINDINGS  No evidence of acute fracture or traumatic subluxation. No gross cervical canal hematoma or prevertebral edema.  There is diffuse,  advanced degenerative disc narrowing and endplate irregularity. When superimposed on a congenitally narrow spinal canal there is diffuse canal stenosis. Central disc herniation at C3-4 results in severe spinal canal stenosis with cord compression. Uncovertebral spurring is diffuse and bulky, with bilateral foraminal stenosis most progressed at C3-4 and C4-5. Degenerative ankylosis has formed across the C5-6 disc.  IMPRESSION: 1. No acute intracranial or cervical spine injury identified. 2. 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 3. Diffuse cervical canal stenosis, as above. Stenosis is severe at the level of C3-4, with cord compression. 4. Generalized brain atrophy, progressive from 2005.   Electronically Signed   By: Tiburcio PeaJonathan  Watts M.D.   On: 08/24/2013 01:48   Mr Brain Wo Contrast  08/24/2013   ADDENDUM REPORT: 08/24/2013 10:47  ADDENDUM: Critical Value/emergent results were called by telephone at the time of interpretation on 08/24/2013 at 10:40 am to Dr. Rhona Leavenshiu who  verbally acknowledged these results.   Electronically Signed   By: Augusto Gamble M.D.   On: 08/24/2013 10:47   08/24/2013   CLINICAL DATA:  63 year old male with syncope and left side weakness. Advanced cervical spine degeneration with strong evidence of spinal stenosis by CT. Initial encounter.  EXAM: MRI HEAD WITHOUT CONTRAST  MRI CERVICAL SPINE WITHOUT CONTRAST  TECHNIQUE: Multiplanar, multiecho pulse sequences of the brain and surrounding structures, and cervical spine, to include the craniocervical junction and cervicothoracic junction, were obtained without intravenous contrast.  COMPARISON:  Head and cervical spine CT 0124 hr the same day.  FINDINGS: MRI HEAD FINDINGS  Cerebral volume is within normal limits for age. No restricted diffusion to suggest acute infarction. No midline shift, mass effect, evidence of mass lesion, ventriculomegaly, extra-axial collection or acute intracranial hemorrhage.  Cervicomedullary junction and pituitary are within normal limits.  Loss of the distal left vertebral artery flow void at the skullbase (series 5, image 3). Other Major intracranial vascular flow voids are preserved, with a degree of generalized intracranial artery dolichoectasia.  Chronic hemorrhagic lacunar infarct extending from the right corona radiata to the lentiform nuclei. Multiple chronic lacunar infarcts of the right caudate nucleus, the largest with chronic blood products. Additional chronic micro hemorrhage in the remaining bilateral deep gray matter nuclei. Chronic micro hemorrhages occasionally noted elsewhere in the brain. Additional nonspecific cerebral white matter T2 and FLAIR hyperintensity. No cortical encephalomalacia identified. Cerebellum within normal limits.  Visible internal auditory structures appear normal. Negative mastoids and paranasal sinuses except for minimal mucosal thickening in the right maxillary sinus. Visualized orbit soft tissues are within normal limits. Visualized scalp soft tissues are within normal limits. Hyperostosis of the calvarium with normal bone marrow signal.  MRI CERVICAL SPINE FINDINGS  Stable cervical vertebral height and alignment with straightening and mild reversal of lordosis. Sclerotic odontoid process on the comparison does demonstrate evidence of mild marrow edema, favor degenerative. There is also low level increased STIR signal between the odontoid tip an the [place] non. The tectorial membrane appears intact. No other abnormal ligamentous signal identified in the cervical spine.  Small volume layering secretions in the pharynx. The cervical left vertebral artery flow void is preserved, but appears diminutive to that on the right.  Abnormal cervical spinal cord signal from C3-C4 to C4-C5, and again at the C5-C6 level. Decreased cord volume, No evidence of hemorrhage within the spinal cord, and these changes are most compatible with myelomalacia.  C2-C3:  Moderate facet hypertrophy on the right. Moderate bilateral C3 foraminal stenosis.  C3-C4: Multifactorial severe spinal stenosis (AP thecal sac reduced to 3-4 millimeters) with spinal cord compression related to right eccentric bulky circumferential disc osteophyte complex. Majority of the posterior disc component is non calcified as seen on the comparison. Superimposed facet and uncovertebral hypertrophy with severe C4 biforaminal stenosis.  C4-C5: Multifactorial moderate to severe spinal stenosis (AP thecal sac reduced to 4 millimeters AP) with spinal cord compression and bulky circumferential disc osteophyte complex. Partially calcified posterior component of disc as seen on the comparison. Uncovertebral hypertrophy with severe C5 biforaminal stenosis.  C5-C6: Circumferential disc osteophyte complex, but the degree of spinal stenosis abates at this level, borderline to mild. Mild ligament flavum hypertrophy. Severe C6 biforaminal stenosis.  C6-C7: Circumferential disc osteophyte complex. Ligament flavum hypertrophy, but no spinal stenosis. Severe C7 biforaminal stenosis.  C7-T1: Trace anterolisthesis. Severe bilateral facet hypertrophy. Moderate ligament flavum hypertrophy. Uncovertebral hypertrophy. No spinal stenosis. Severe C8 biforaminal stenosis.  No upper thoracic spinal  stenosis.  IMPRESSION: 1. Severe degenerative cervical spinal stenosis at C3-C4 and C4-C5. Spinal cord compression at both levels with cord volume loss and abnormal signal most suggestive of myelomalacia. Borderline to mild spinal stenosis at C5-C6 were there is also evidence of abnormal cord signal favored to be chronic. No evidence of spinal cord hemorrhage. 2. Diffuse severe cervical foraminal stenosis. 3. No acute infarct. 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 4. Poor flow or occlusion of the distal left vertebral artery at the skullbase, suspect  chronic given the absence of acute posterior fossa signal changes.  Electronically Signed: By: Augusto Gamble M.D. On: 08/24/2013 10:26   Mr Cervical Spine Wo Contrast  08/24/2013   ADDENDUM REPORT: 08/24/2013 10:47  ADDENDUM: Critical Value/emergent results were called by telephone at the time of interpretation on 08/24/2013 at 10:40 am to Dr. Rhona Leavens who verbally acknowledged these results.   Electronically Signed   By: Augusto Gamble M.D.   On: 08/24/2013 10:47   08/24/2013   CLINICAL DATA:  63 year old male with syncope and left side weakness. Advanced cervical spine degeneration with strong evidence of spinal stenosis by CT. Initial encounter.  EXAM: MRI HEAD WITHOUT CONTRAST  MRI CERVICAL SPINE WITHOUT CONTRAST  TECHNIQUE: Multiplanar, multiecho pulse sequences of the brain and surrounding structures, and cervical spine, to include the craniocervical junction and cervicothoracic junction, were obtained without intravenous contrast.  COMPARISON:  Head and cervical spine CT 0124 hr the same day.  FINDINGS: MRI HEAD FINDINGS  Cerebral volume is within normal limits for age. No restricted diffusion to suggest acute infarction. No midline shift, mass effect, evidence of mass lesion, ventriculomegaly, extra-axial collection or acute intracranial hemorrhage. Cervicomedullary junction and pituitary are within normal limits.  Loss of the distal left vertebral artery flow void at the skullbase (series 5, image 3). Other Major intracranial vascular flow voids are preserved, with a degree of generalized intracranial artery dolichoectasia.  Chronic hemorrhagic lacunar infarct extending from the right corona radiata to the lentiform nuclei. Multiple chronic lacunar infarcts of the right caudate nucleus, the largest with chronic blood products. Additional chronic micro hemorrhage in the remaining bilateral deep gray matter nuclei. Chronic micro hemorrhages occasionally noted elsewhere in the brain. Additional nonspecific cerebral  white matter T2 and FLAIR hyperintensity. No cortical encephalomalacia identified. Cerebellum within normal limits.  Visible internal auditory structures appear normal. Negative mastoids and paranasal sinuses except for minimal mucosal thickening in the right maxillary sinus. Visualized orbit soft tissues are within normal limits. Visualized scalp soft tissues are within normal limits. Hyperostosis of the calvarium with normal bone marrow signal.  MRI CERVICAL SPINE FINDINGS  Stable cervical vertebral height and alignment with straightening and mild reversal of lordosis. Sclerotic odontoid process on the comparison does demonstrate evidence of mild marrow edema, favor degenerative. There is also low level increased STIR signal between the odontoid tip an the [place] non. The tectorial membrane appears intact. No other abnormal ligamentous signal identified in the cervical spine.  Small volume layering secretions in the pharynx. The cervical left vertebral artery flow void is preserved, but appears diminutive to that on the right.  Abnormal cervical spinal cord signal from C3-C4 to C4-C5, and again at the C5-C6 level. Decreased cord volume, No evidence of hemorrhage within the spinal cord, and these changes are most compatible with myelomalacia.  C2-C3: Moderate facet hypertrophy on the right. Moderate bilateral C3 foraminal stenosis.  C3-C4: Multifactorial severe  spinal stenosis (AP thecal sac reduced to 3-4 millimeters) with spinal cord compression related to right eccentric bulky circumferential disc osteophyte complex. Majority of the posterior disc component is non calcified as seen on the comparison. Superimposed facet and uncovertebral hypertrophy with severe C4 biforaminal stenosis.  C4-C5: Multifactorial moderate to severe spinal stenosis (AP thecal sac reduced to 4 millimeters AP) with spinal cord compression and bulky circumferential disc osteophyte complex. Partially calcified posterior component of disc as  seen on the comparison. Uncovertebral hypertrophy with severe C5 biforaminal stenosis.  C5-C6: Circumferential disc osteophyte complex, but the degree of spinal stenosis abates at this level, borderline to mild. Mild ligament flavum hypertrophy. Severe C6 biforaminal stenosis.  C6-C7: Circumferential disc osteophyte complex. Ligament flavum hypertrophy, but no spinal stenosis. Severe C7 biforaminal stenosis.  C7-T1: Trace anterolisthesis. Severe bilateral facet hypertrophy. Moderate ligament flavum hypertrophy. Uncovertebral hypertrophy. No spinal stenosis. Severe C8 biforaminal stenosis.  No upper thoracic spinal stenosis.  IMPRESSION: 1. Severe degenerative cervical spinal stenosis at C3-C4 and C4-C5. Spinal cord compression at both levels with cord volume loss and abnormal signal most suggestive of myelomalacia. Borderline to mild spinal stenosis at C5-C6 were there is also evidence of abnormal cord signal favored to be chronic. No evidence of spinal cord hemorrhage. 2. Diffuse severe cervical foraminal stenosis. 3. No acute infarct. 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 4. Poor flow or occlusion of the distal left vertebral artery at the skullbase, suspect chronic given the absence of acute posterior fossa signal changes.  Electronically Signed: By: Augusto Gamble M.D. On: 08/24/2013 10:26    Scheduled Meds: . enoxaparin (LOVENOX) injection  40 mg Subcutaneous Q24H  . folic acid  1 mg Oral Daily  . LORazepam  0-4 mg Oral Q6H   Followed by  . [START ON 08/26/2013] LORazepam  0-4 mg Oral Q12H  . multivitamin with minerals  1 tablet Oral Daily  . [START ON 08/25/2013] pneumococcal 23 valent vaccine  0.5 mL Intramuscular Tomorrow-1000  . sodium chloride  3 mL Intravenous Q12H  . sodium chloride  3 mL Intravenous Q12H  . thiamine  100 mg Oral Daily   Or  . thiamine  100 mg Intravenous Daily   Continuous Infusions: .  sodium chloride      Principal Problem:   Left-sided weakness Active Problems:   Syncope   Anemia  Time spent:  Layann Bluett K  [facility] Pager [phone]. If 7PM-7AM, please contact night-coverage at [email or web address], password [facility] 08/24/2013, 2:16 PM  LOS: 0 days

## 2013-08-24 NOTE — ED Provider Notes (Signed)
CSN: [id number]     Arrival date & time 08/24/13  0030 History   First MD Initiated Contact with Patient 08/24/13 0030     Chief Complaint  Patient presents with  . Alcohol Intoxication     (Consider location/radiation/quality/duration/timing/severity/associated sxs/prior Treatment) HPI Comments: Patient presents via EMS after found laying in a parking lot laying in wet clothes.  He endorses alcohol use tonight. He states he fell down and stumbled while drinking. Denies hitting head or losing consciousness. States he drinks every day. Denies any medical problems or other drug use. He complains of difficulty using his left hand and arm as well as his leg for the past 2 weeks. Denies any headache, chest pain, neck pain, abdominal pain. No fevers or vomiting. Patient is awake and alert. He is oriented to himself, the situation and time. He smells of alcohol.  The history is provided by the patient.    Past Medical History  Diagnosis Date  . Back pain    History reviewed. No pertinent past surgical history. Family History  Problem Relation Age of Onset  . CAD Mother   . CAD Father    History  Substance Use Topics  . Smoking status: Current Every Day Smoker -- 0.50 packs/day for 40 years    Types: Cigarettes  . Smokeless tobacco: Never Used  . Alcohol Use: Yes     Comment: Heavily    Review of Systems  Constitutional: Positive for activity change and appetite change. Negative for fever and fatigue.  HENT: Negative for congestion and rhinorrhea.   Eyes: Negative for visual disturbance.  Respiratory: Negative for cough, chest tightness and shortness of breath.   Cardiovascular: Negative for chest pain.  Gastrointestinal: Negative for nausea, vomiting and abdominal pain.  Genitourinary: Negative for dysuria and hematuria.  Musculoskeletal: Negative for back pain.  Neurological: Positive for facial asymmetry, weakness, light-headedness and numbness. Negative for dizziness and  headaches.  A complete 10 system review of systems was obtained and all systems are negative except as noted in the HPI and PMH.      Allergies  Review of patient's allergies indicates no known allergies.  Home Medications   Prior to Admission medications   Not on File   BP 149/93  Pulse 78  Temp(Src) 98.2 F (36.8 C) (Oral)  Resp 18  Ht 5\' 7"  (1.702 m)  Wt 141 lb 3.2 oz (64.048 kg)  BMI 22.11 kg/m2  SpO2 100% Physical Exam  Nursing note and vitals reviewed. Constitutional: He is oriented to person, place, and time. He appears well-developed and well-nourished. No distress.  Smells of alcohol  HENT:  Head: Normocephalic and atraumatic.  Mouth/Throat: Oropharynx is clear and moist. No oropharyngeal exudate.  Eyes: Conjunctivae and EOM are normal. Pupils are equal, round, and reactive to light.  Neck: Normal range of motion. Neck supple.  No C spine pain  Cardiovascular: Normal rate, regular rhythm, normal heart sounds and intact distal pulses.   No murmur heard. Pulmonary/Chest: Effort normal and breath sounds normal. No respiratory distress.  Abdominal: Soft. There is no tenderness. There is no rebound and no guarding.  Musculoskeletal: Normal range of motion. He exhibits no edema and no tenderness.  No T or L spine pain.  Neurological: He is alert and oriented to person, place, and time. No cranial nerve deficit. He exhibits normal muscle tone. Coordination normal.  Pronator drift on L. 4/5 strength in LUE and LLE.  Weak grip strength on L.  CN 2-12 intact  Skin: Skin is warm.  Psychiatric: He has a normal mood and affect. His behavior is normal.    ED Course  Procedures (including critical care time) Labs Review Labs Reviewed  CBC WITH DIFFERENTIAL - Abnormal; Notable for the following:    RBC 4.06 (*)    Hemoglobin 12.3 (*)    HCT 36.2 (*)    Platelets 105 (*)    All other components within normal limits  COMPREHENSIVE METABOLIC PANEL - Abnormal; Notable for  the following:    Anion gap 17 (*)    All other components within normal limits  ETHANOL - Abnormal; Notable for the following:    Alcohol, Ethyl (B) 213 (*)    All other components within normal limits  CK - Abnormal; Notable for the following:    Total CK 392 (*)    All other components within normal limits  URINALYSIS, ROUTINE W REFLEX MICROSCOPIC - Abnormal; Notable for the following:    Specific Gravity, Urine 1.004 (*)    All other components within normal limits  CBC - Abnormal; Notable for the following:    RBC 4.08 (*)    Hemoglobin 12.1 (*)    HCT 35.5 (*)    Platelets 106 (*)    All other components within normal limits  I-STAT CHEM 8, ED - Abnormal; Notable for the following:    Calcium, Ion 1.09 (*)    All other components within normal limits  PROTIME-INR  TROPONIN I  APTT  CREATININE, SERUM  TROPONIN I  URINE RAPID DRUG SCREEN (HOSP PERFORMED)  TROPONIN I  TROPONIN I  I-STAT TROPOININ, ED  I-STAT TROPOININ, ED    Imaging Review Ct Head Wo Contrast  08/24/2013   CLINICAL DATA:  Alcohol intoxication.  Unable to ambulate.  EXAM: CT HEAD WITHOUT CONTRAST  CT CERVICAL SPINE WITHOUT CONTRAST  TECHNIQUE: Multidetector CT imaging of the head and cervical spine was performed following the standard protocol without intravenous contrast. Multiplanar CT image reconstructions of the cervical spine were also generated.  COMPARISON:  08/07/2003 head CT  FINDINGS: CT HEAD FINDINGS  Skull and Sinuses:Negative for fracture or destructive process. The mastoids, middle ears, and imaged paranasal sinuses are clear.  Orbits: No acute abnormality.  Brain: No evidence of acute abnormality, such as acute infarction, hemorrhage, hydrocephalus, or mass lesion/mass effect. There are new but discrete and likely remote lacunar infarcts involving the right caudate head and right caudate body/ corona radiata.  Brain atrophy which is generalized and progressive from 2005.  CT CERVICAL SPINE FINDINGS   No evidence of acute fracture or traumatic subluxation. No gross cervical canal hematoma or prevertebral edema.  There is diffuse, advanced degenerative disc narrowing and endplate irregularity. When superimposed on a congenitally narrow spinal canal there is diffuse canal stenosis. Central disc herniation at C3-4 results in severe spinal canal stenosis with cord compression. Uncovertebral spurring is diffuse and bulky, with bilateral foraminal stenosis most progressed at C3-4 and C4-5. Degenerative ankylosis has formed across the C5-6 disc.  IMPRESSION: 1. No acute intracranial or cervical spine injury identified. 2. 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 3. Diffuse cervical canal stenosis, as above. Stenosis is severe at the level of C3-4, with cord compression. 4. Generalized brain atrophy, progressive from 2005.   Electronically Signed   By: Tiburcio Pea M.D.   On: 08/24/2013 01:48   Ct Cervical Spine Wo Contrast  08/24/2013   CLINICAL DATA:  Alcohol intoxication.  Unable to ambulate.  EXAM: CT HEAD WITHOUT CONTRAST  CT CERVICAL SPINE WITHOUT CONTRAST  TECHNIQUE: Multidetector CT imaging of the head and cervical spine was performed following the standard protocol without intravenous contrast. Multiplanar CT image reconstructions of the cervical spine were also generated.  COMPARISON:  08/07/2003 head CT  FINDINGS: CT HEAD FINDINGS  Skull and Sinuses:Negative for fracture or destructive process. The mastoids, middle ears, and imaged paranasal sinuses are clear.  Orbits: No acute abnormality.  Brain: No evidence of acute abnormality, such as acute infarction, hemorrhage, hydrocephalus, or mass lesion/mass effect. There are new but discrete and likely remote lacunar infarcts involving the right caudate head and right caudate body/ corona radiata.  Brain atrophy which is generalized and progressive from 2005.  CT CERVICAL SPINE FINDINGS  No evidence of acute  fracture or traumatic subluxation. No gross cervical canal hematoma or prevertebral edema.  There is diffuse, advanced degenerative disc narrowing and endplate irregularity. When superimposed on a congenitally narrow spinal canal there is diffuse canal stenosis. Central disc herniation at C3-4 results in severe spinal canal stenosis with cord compression. Uncovertebral spurring is diffuse and bulky, with bilateral foraminal stenosis most progressed at C3-4 and C4-5. Degenerative ankylosis has formed across the C5-6 disc.  IMPRESSION: 1. No acute intracranial or cervical spine injury identified. 2. 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 3. Diffuse cervical canal stenosis, as above. Stenosis is severe at the level of C3-4, with cord compression. 4. Generalized brain atrophy, progressive from 2005.   Electronically Signed   By: Tiburcio Pea M.D.   On: 08/24/2013 01:48     EKG Interpretation   Date/Time:  Wednesday August 24 2013 00:48:14 EDT Ventricular Rate:  66 PR Interval:  167 QRS Duration: 125 QT Interval:  461 QTC Calculation: 483 R Axis:   84 Text Interpretation:  Sinus rhythm Left ventricular hypertrophy Minimal ST  elevation, anterolateral leads Borderline prolonged QT interval Artifact  in lead(s) II III aVR aVF V1 V2 V3 V4 V5 V6 No previous ECGs available  Confirmed by Manus Gunning  MD, Donyale Berthold [phone]) on 08/24/2013 1:18:15 AM      MDM   Final diagnoses:  Left-sided weakness  Cervical stenosis of spine  Alcohol intoxication, uncomplicated   Intoxication without obvious injury. Patient found to have left-sided weakness which has been ongoing for at least 2 weeks.  Labs remarkable for alcohol intoxication. CT scan shows remote appearing lacunar infarct. No hemorrhage. There is diffuse cervical canal stenosis  Left-sided weakness is concerning for new infarct versus cervical canal stenosis.  Discussed with Dr. Selena Batten IV Decadron given  for possible cord compression. D/w Dr. Conchita Paris of neurosurgery who recommends MRI C-spine and agrees with IV Decadron. Will be admitted for stroke workup as well as MRI C spine.  Glynn Octave, MD 08/24/13 [phone]

## 2013-08-24 NOTE — ED Notes (Addendum)
Pt presents to ED via EMS for ETOH intoxication. Pt was found in a parking lot in rain unable to ambulate. Pt admit drinking one pint of liquor tonight.  Pt also has left hand in grip position and unable to open the hand, pt states onset 2 weeks. Pt alerts and oriented x4 at arrival. Per EMS, BP-150/90, RR-18, CBG-116. Pt came covered in stool, cleaned by ED staff.

## 2013-08-24 NOTE — ED Notes (Signed)
Patient transported to CT 

## 2013-08-24 NOTE — H&P (Signed)
Albert Bond is an 63 y.o. male.   Chief Complaint: syncope HPI: 63 yo male with c/o syncope, pt not certain how long he was out for.  This occurred while he was leaving his sons birthday.  Denies cp, palp, sob, lower ext edema, pt cant move left side quite as well.  Ed requests that pt be admitted for syncope and left sided weakness r/o CVA. And for evaluation of ? Cord compression on CT scan. ED states neurosurgery consulted , appreciate input.   Past Medical History  Diagnosis Date  . Back pain     History reviewed. No pertinent past surgical history.  Family History  Problem Relation Age of Onset  . CAD Mother   . CAD Father    Social History:  reports that he has been smoking Cigarettes.  He has a 20 pack-year smoking history. He does not have any smokeless tobacco history on file. He reports that he drinks alcohol. He reports that he uses illicit drugs (Marijuana).  Allergies: No Known Allergies   (Not in a hospital admission)  Results for orders placed during the hospital encounter of 08/24/13 (from the past 48 hour(s))  CBC WITH DIFFERENTIAL     Status: Abnormal   Collection Time    08/24/13  1:01 AM      Result Value Ref Range   WBC 5.6  4.0 - 10.5 K/uL   RBC 4.06 (*) 4.22 - 5.81 MIL/uL   Hemoglobin 12.3 (*) 13.0 - 17.0 g/dL   HCT 36.2 (*) 39.0 - 52.0 %   MCV 89.2  78.0 - 100.0 fL   MCH 30.3  26.0 - 34.0 pg   MCHC 34.0  30.0 - 36.0 g/dL   RDW 14.4  11.5 - 15.5 %   Platelets 105 (*) 150 - 400 K/uL   Comment: REPEATED TO VERIFY     SPECIMEN CHECKED FOR CLOTS     PLATELET COUNT CONFIRMED BY SMEAR   Neutrophils Relative % 59  43 - 77 %   Neutro Abs 3.3  1.7 - 7.7 K/uL   Lymphocytes Relative 35  12 - 46 %   Lymphs Abs 2.0  0.7 - 4.0 K/uL   Monocytes Relative 5  3 - 12 %   Monocytes Absolute 0.3  0.1 - 1.0 K/uL   Eosinophils Relative 1  0 - 5 %   Eosinophils Absolute 0.0  0.0 - 0.7 K/uL   Basophils Relative 0  0 - 1 %   Basophils Absolute 0.0  0.0 - 0.1  K/uL  COMPREHENSIVE METABOLIC PANEL     Status: Abnormal   Collection Time    08/24/13  1:01 AM      Result Value Ref Range   Sodium 140  137 - 147 mEq/L   Potassium 4.4  3.7 - 5.3 mEq/L   Chloride 100  96 - 112 mEq/L   CO2 23  19 - 32 mEq/L   Glucose, Bld 98  70 - 99 mg/dL   BUN 10  6 - 23 mg/dL   Creatinine, Ser 0.87  0.50 - 1.35 mg/dL   Calcium 9.4  8.4 - 10.5 mg/dL   Total Protein 7.9  6.0 - 8.3 g/dL   Albumin 4.2  3.5 - 5.2 g/dL   AST 23  0 - 37 U/L   ALT 8  0 - 53 U/L   Alkaline Phosphatase 57  39 - 117 U/L   Total Bilirubin 0.5  0.3 - 1.2 mg/dL  GFR calc non Af Amer >90  >90 mL/min   GFR calc Af Amer >90  >90 mL/min   Comment: (NOTE)     The eGFR has been calculated using the CKD EPI equation.     This calculation has not been validated in all clinical situations.     eGFR's persistently <90 mL/min signify possible Chronic Kidney     Disease.   Anion gap 17 (*) 5 - 15  ETHANOL     Status: Abnormal   Collection Time    08/24/13  1:01 AM      Result Value Ref Range   Alcohol, Ethyl (B) 213 (*) 0 - 11 mg/dL   Comment:            LOWEST DETECTABLE LIMIT FOR     SERUM ALCOHOL IS 11 mg/dL     FOR MEDICAL PURPOSES ONLY  CK     Status: Abnormal   Collection Time    08/24/13  1:01 AM      Result Value Ref Range   Total CK 392 (*) 7 - 232 U/L  URINALYSIS, ROUTINE W REFLEX MICROSCOPIC     Status: Abnormal   Collection Time    08/24/13  1:01 AM      Result Value Ref Range   Color, Urine YELLOW  YELLOW   APPearance CLEAR  CLEAR   Specific Gravity, Urine 1.004 (*) 1.005 - 1.030   pH 6.0  5.0 - 8.0   Glucose, UA NEGATIVE  NEGATIVE mg/dL   Hgb urine dipstick NEGATIVE  NEGATIVE   Bilirubin Urine NEGATIVE  NEGATIVE   Ketones, ur NEGATIVE  NEGATIVE mg/dL   Protein, ur NEGATIVE  NEGATIVE mg/dL   Urobilinogen, UA 0.2  0.0 - 1.0 mg/dL   Nitrite NEGATIVE  NEGATIVE   Leukocytes, UA NEGATIVE  NEGATIVE   Comment: MICROSCOPIC NOT DONE ON URINES WITH NEGATIVE PROTEIN, BLOOD,  LEUKOCYTES, NITRITE, OR GLUCOSE <1000 mg/dL.  PROTIME-INR     Status: None   Collection Time    08/24/13  1:01 AM      Result Value Ref Range   Prothrombin Time 13.5  11.6 - 15.2 seconds   INR 1.03  0.00 - 1.49  TROPONIN I     Status: None   Collection Time    08/24/13  1:01 AM      Result Value Ref Range   Troponin I <0.30  <0.30 ng/mL   Comment:            Due to the release kinetics of cTnI,     a negative result within the first hours     of the onset of symptoms does not rule out     myocardial infarction with certainty.     If myocardial infarction is still suspected,     repeat the test at appropriate intervals.  APTT     Status: None   Collection Time    08/24/13  1:01 AM      Result Value Ref Range   aPTT 26  24 - 37 seconds  I-STAT TROPOININ, ED     Status: None   Collection Time    08/24/13  1:09 AM      Result Value Ref Range   Troponin i, poc 0.00  0.00 - 0.08 ng/mL   Comment 3            Comment: Due to the release kinetics of cTnI,     a negative result within the first  hours     of the onset of symptoms does not rule out     myocardial infarction with certainty.     If myocardial infarction is still suspected,     repeat the test at appropriate intervals.  I-STAT CHEM 8, ED     Status: Abnormal   Collection Time    08/24/13  1:10 AM      Result Value Ref Range   Sodium 139  137 - 147 mEq/L   Potassium 4.2  3.7 - 5.3 mEq/L   Chloride 101  96 - 112 mEq/L   BUN 8  6 - 23 mg/dL   Creatinine, Ser 1.20  0.50 - 1.35 mg/dL   Glucose, Bld 99  70 - 99 mg/dL   Calcium, Ion 1.09 (*) 1.13 - 1.30 mmol/L   TCO2 24  0 - 100 mmol/L   Hemoglobin 14.3  13.0 - 17.0 g/dL   HCT 42.0  39.0 - 52.0 %   Ct Head Wo Contrast  08/24/2013   CLINICAL DATA:  Alcohol intoxication.  Unable to ambulate.  EXAM: CT HEAD WITHOUT CONTRAST  CT CERVICAL SPINE WITHOUT CONTRAST  TECHNIQUE: Multidetector CT imaging of the head and cervical spine was performed following the standard protocol  without intravenous contrast. Multiplanar CT image reconstructions of the cervical spine were also generated.  COMPARISON:  08/07/2003 head CT  FINDINGS: CT HEAD FINDINGS  Skull and Sinuses:Negative for fracture or destructive process. The mastoids, middle ears, and imaged paranasal sinuses are clear.  Orbits: No acute abnormality.  Brain: No evidence of acute abnormality, such as acute infarction, hemorrhage, hydrocephalus, or mass lesion/mass effect. There are new but discrete and likely remote lacunar infarcts involving the right caudate head and right caudate body/ corona radiata.  Brain atrophy which is generalized and progressive from 2005.  CT CERVICAL SPINE FINDINGS  No evidence of acute fracture or traumatic subluxation. No gross cervical canal hematoma or prevertebral edema.  There is diffuse, advanced degenerative disc narrowing and endplate irregularity. When superimposed on a congenitally narrow spinal canal there is diffuse canal stenosis. Central disc herniation at C3-4 results in severe spinal canal stenosis with cord compression. Uncovertebral spurring is diffuse and bulky, with bilateral foraminal stenosis most progressed at C3-4 and C4-5. Degenerative ankylosis has formed across the C5-6 disc.  IMPRESSION: 1. No acute intracranial or cervical spine injury identified. 2. 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 3. Diffuse cervical canal stenosis, as above. Stenosis is severe at the level of C3-4, with cord compression. 4. Generalized brain atrophy, progressive from 2005.   Electronically Signed   By: Jorje Guild M.D.   On: 08/24/2013 01:48   Ct Cervical Spine Wo Contrast  08/24/2013   CLINICAL DATA:  Alcohol intoxication.  Unable to ambulate.  EXAM: CT HEAD WITHOUT CONTRAST  CT CERVICAL SPINE WITHOUT CONTRAST  TECHNIQUE: Multidetector CT imaging of the head and cervical spine was performed following the standard protocol without intravenous  contrast. Multiplanar CT image reconstructions of the cervical spine were also generated.  COMPARISON:  08/07/2003 head CT  FINDINGS: CT HEAD FINDINGS  Skull and Sinuses:Negative for fracture or destructive process. The mastoids, middle ears, and imaged paranasal sinuses are clear.  Orbits: No acute abnormality.  Brain: No evidence of acute abnormality, such as acute infarction, hemorrhage, hydrocephalus, or mass lesion/mass effect. There are new but discrete and likely remote lacunar infarcts involving the right caudate head and right caudate body/ corona radiata.  Brain  atrophy which is generalized and progressive from 2005.  CT CERVICAL SPINE FINDINGS  No evidence of acute fracture or traumatic subluxation. No gross cervical canal hematoma or prevertebral edema.  There is diffuse, advanced degenerative disc narrowing and endplate irregularity. When superimposed on a congenitally narrow spinal canal there is diffuse canal stenosis. Central disc herniation at C3-4 results in severe spinal canal stenosis with cord compression. Uncovertebral spurring is diffuse and bulky, with bilateral foraminal stenosis most progressed at C3-4 and C4-5. Degenerative ankylosis has formed across the C5-6 disc.  IMPRESSION: 1. No acute intracranial or cervical spine injury identified. 2. 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 3. Diffuse cervical canal stenosis, as above. Stenosis is severe at the level of C3-4, with cord compression. 4. Generalized brain atrophy, progressive from 2005.   Electronically Signed   By: Jorje Guild M.D.   On: 08/24/2013 01:48    ROS negative for all organ systems except for + above  Blood pressure 111/76, pulse 58, temperature 97.6 F (36.4 C), temperature source Oral, resp. rate 13, SpO2 100.00%. Physical Exam  Heent: anicteric, pupils 1.35m symmmetric, direct consensual, near intact Neck: no jvd, no bruit,  Heart: rrr s1, s2,  Lung: ctab Abd:  soft, nt, nd, +bs Ext: no c/c/e Sklin: no rash Neuro:  Nonfocal, cn2-12 intact, reflexes 2+ symmetric, diffuse with downgoing toes bilaterally.   Assessment/Plan Syncope Tele, cycle cardiac markers Check carotid u/s, echo  ? L sided weakness R/o cord compression,  Check MRI Appreciate neurosurgery input  Anemia:  Repeat cbc in am  Thrombocytopenia: repeat cbc  Alcohol intoxication:  observe  KSHYKEEM, RESURRECCION8/19/2015, 3:43 AM

## 2013-08-24 NOTE — Progress Notes (Signed)
UR Completed Jaryan Chicoine Graves-Bigelow, RN,BSN [phone]  

## 2013-08-24 NOTE — ED Notes (Signed)
Report attempted 

## 2013-08-24 NOTE — Progress Notes (Signed)
*  PRELIMINARY RESULTS* Vascular Ultrasound Carotid Duplex (Doppler) has been completed.  Findings suggest 1-39% internal carotid artery stenosis bilaterally. Vertebral arteries are patent with antegrade flow.  08/24/2013 10:47 AM Gertie FeyMichelle Amariyon Maynes, RVT, RDCS, RDMS

## 2013-08-24 NOTE — Consult Note (Signed)
CC:  Chief Complaint  Patient presents with  . Alcohol Intoxication    HPI: Albert Bond is a 63 y.o. male admitted through the emergency department after coming in complaining of 2 weeks of progressively worsening left-sided weakness. The patient states that over these past 2 weeks he had sudden onset of difficulty using his left arm and hand. He also says that his left leg is weak. He has able to walk, however he has to drag his leg. Upon further questioning, he says that he does have some weakness in both his legs, and actually has to pick his legs up. He denies N/T. He does not have any symptoms of bladder incontinence, or retention. He does not have any pain symptoms, aside from some focal pain around his left knee. He actually denies neck or arm pain. He has never had any similar symptoms in the past.  PMH: Past Medical History  Diagnosis Date  . Back pain     PSH: History reviewed. No pertinent past surgical history.  SH: History  Substance Use Topics  . Smoking status: Current Every Day Smoker -- 0.50 packs/day for 40 years    Types: Cigarettes  . Smokeless tobacco: Never Used  . Alcohol Use: Yes     Comment: Heavily    MEDS: Prior to Admission medications   Not on File    ALLERGY: No Known Allergies  ROS: Review of Systems  Constitutional: Negative for fever and chills.  HENT: Negative for hearing loss.   Eyes: Negative for blurred vision and double vision.  Respiratory: Negative for cough.   Cardiovascular: Negative for chest pain.  Gastrointestinal: Negative for heartburn.  Genitourinary: Negative for dysuria.  Musculoskeletal: Positive for joint pain. Negative for back pain and neck pain.  Skin: Negative for rash.  Neurological: Positive for focal weakness. Negative for dizziness, sensory change and headaches.  Endo/Heme/Allergies: Does not bruise/bleed easily.  Psychiatric/Behavioral: Negative for memory loss.    NEUROLOGIC EXAM: Awake, alert,  oriented Memory and concentration grossly intact Speech fluent, appropriate CN grossly intact Motor exam: Upper Extremities Deltoid Bicep Tricep Grip  Right 4+/5 4+/5 4/5 4/5  Left 4/5 4/5 4/5 3/5   Lower Extremity IP Quad PF DF EHL  Right 4/5 5/5 5/5 5/5 5/5  Left 4/5 5/5 5/5 5/5 5/5   Sensation grossly intact to LT (+) bilateral pectoralis reflex (+) right Hoffman's  IMGAING: MRI of the cervical spine without contrast was reviewed which demonstrates straining of normal cervical lordosis. There is multilevel cervical spondylosis. There is disc height loss at C3-4 and C4-5 in combination with congenital spinal stenosis. This causes severe spinal stenosis at C3-4 and C4-5.  IMPRESSION: - 63 y.o. 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   I did speak with the patient and review with him the MRI findings in the neck. I told him that his symptoms are likely related to severe canal stenosis and spinal cord compression. I therefore recommended the above surgical decompression and fusion. The risks of surgery were explained in detail including worsening spinal cord injury, CSF leak, postoperative hematoma, bleeding infection. The patient understood our discussion and is willing to proceed with surgery. All his questions were answered.

## 2013-08-24 NOTE — ED Notes (Signed)
Pt back from CT

## 2013-08-25 ENCOUNTER — Other Ambulatory Visit: Payer: Self-pay | Admitting: Neurosurgery

## 2013-08-25 DIAGNOSIS — M4802 Spinal stenosis, cervical region: Secondary | ICD-10-CM

## 2013-08-25 DIAGNOSIS — R55 Syncope and collapse: Secondary | ICD-10-CM

## 2013-08-25 DIAGNOSIS — F1010 Alcohol abuse, uncomplicated: Secondary | ICD-10-CM

## 2013-08-25 LAB — PROTIME-INR
INR: 0.98 (ref 0.00–1.49)
Prothrombin Time: 13 seconds (ref 11.6–15.2)

## 2013-08-25 LAB — APTT: APTT: 30 s (ref 24–37)

## 2013-08-25 MED ORDER — CEFAZOLIN SODIUM-DEXTROSE 2-3 GM-% IV SOLR
2.0000 g | INTRAVENOUS | Status: DC
  Filled 2013-08-25: qty 50

## 2013-08-25 NOTE — Plan of Care (Signed)
Problem: Acute Rehab PT Goals(only PT should resolve) Goal: Pt Will Go Sit To Supine/Side Using correct body mechanics Goal: Pt Will Transfer Bed To Chair/Chair To Bed On LRAD Goal: Pt Will Ambulate In safe upright postural control

## 2013-08-25 NOTE — Progress Notes (Signed)
Occupational Therapy Evaluation Patient Details Name: Albert Bond MRN: [id number] DOB: Nov 18, 1950 Today's Date: 08/25/2013    History of Present Illness 63 yo male with c/o syncope, pt not certain how long he was out for.  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  Cervical canal stenosis.  PMHx:  back pain, alcohol abuse, remote lacunar infarct, Hx of ETOH use. On CIWA protocol.   Clinical Impression    PTA, pt lived in handicapped accessible home with family members. Prior to recent weakness, pt was independent with ADL and mobility.Pt began to have more difficulty with ADL and mobility, including recent fall, over past 2 weeks. Pt presents with BUE weakness, sensory deficits and increase in spasticity - LUE > than RUE.   Pt with decreased independence and safety with ADL due to deficits. Pt will benefit from skilled OT to maximize functional level of independence with use of compensatory techniques/DME and AE. Per neurosurgery, pt will have spinal fusion surgery. Will assess most appropriate D/C plan after surgery. Pt will need initial 24/7 S after surgery.       Follow Up Recommendations  Home health OT;Supervision/Assistance - 24 hour (pending surgery)    Equipment Recommendations  Other (comment) (RW)    Recommendations for Other Services       Precautions / Restrictions Precautions Precautions: Fall Precaution Comments: poor safety/judgement Restrictions Weight Bearing Restrictions: No      Mobility Bed Mobility Overal bed mobility: Needs Assistance Bed Mobility: Sidelying to Sit   Sidelying to sit: Min guard       General bed mobility comments: pt up in chair  Transfers Overall transfer level: Needs assistance Equipment used: Rolling walker (2 wheeled) Transfers: Sit to/from [employer] Sit to Stand: Min guard Stand pivot transfers: Min guard       General transfer comment:  cues for sae RW use    Balance Overall balance assessment: Needs assistance Sitting-balance support: Feet supported;Bilateral upper extremity supported Sitting balance-Leahy Scale: Fair   Postural control: Left lateral lean Standing balance support: Bilateral upper extremity supported;During functional activity Standing balance-Leahy Scale: Poor                              ADL Overall ADL's : Needs assistance/impaired Eating/Feeding: Set up;Sitting Eating/Feeding Details (indicate cue type and reason): difficulty holding utensils;opening packages;cutting food. Pt given built up tubing for utensils. May benefit from rocker knife Grooming: Minimal assistance;Oral care;Wash/dry face Grooming Details (indicate cue type and reason): difficulty manipulating objects Upper Body Bathing: Set up;Sitting   Lower Body Bathing: Minimal assistance;Sit to/from stand   Upper Body Dressing : Minimal assistance;Sitting   Lower Body Dressing: Moderate assistance;Sit to/from stand   Toilet Transfer: Min guard;Stand-pivot   Toileting- Architect and Hygiene: Supervision/safety       Functional mobility during ADLs: Min guard;Rolling walker General ADL Comments: decreased safety awareness/awareness of deficits increases fall risk. Will benefit from AE for feeding/ADL     Vision                     Perception     Praxis      Pertinent Vitals/Pain Pain Assessment: No/denies pain     Hand Dominance Right   Extremity/Trunk Assessment Upper Extremity Assessment Upper Extremity Assessment: RUE deficits/detail;LUE deficits/detail RUE Deficits / Details: generalized weakness. shoulder/elbow wrist @ 4/5 throughoutable to make full fist/full extension.  Able to oppose index and middle fingers only. weak pinch. grip strength @ 3+/5. pinch @ 3/5. difficulty with in hand manipulation skills RUE Sensation: decreased light touch RUE Coordination: decreased fine  motor;decreased gross motor (greater sensory loss in hand) LUE Deficits / Details: AROM shoulder/elbow WFL. Strength @ 3+/%. unable to achieve full digit/wrist extension with apparent increase in flexor tone/spasticity. Able to make gross grasp. unable to fully extend digits. unable to oppose digits LUE Sensation: decreased light touch (greater sensory loss in hand) LUE Coordination: decreased gross motor;decreased fine motor   Lower Extremity Assessment Lower Extremity Assessment: Defer to PT evaluation LLE Sensation: decreased light touch LLE Coordination: decreased gross motor   Cervical / Trunk Assessment Cervical / Trunk Assessment: Kyphotic   Communication Communication Communication: No difficulties   Cognition Arousal/Alertness: Awake/alert Behavior During Therapy: WFL for tasks assessed/performed Overall Cognitive Status: No family/caregiver present to determine baseline cognitive functioning       Memory: Decreased recall of precautions;Decreased short-term memory (most likely baseline cognitive deficits)             General Comments       Exercises Exercises: Other exercises Other Exercises Other Exercises: "prayer stretch" into composite extension   Shoulder Instructions      Home Living Family/patient expects to be discharged to:: Private residence Living Arrangements: Other relatives Available Help at Discharge: Family;Available PRN/intermittently Type of Home: House Home Access: Ramped entrance Entrance Stairs-Number of Steps: 3 Entrance Stairs-Rails: Can reach both Home Layout: One level     Bathroom Shower/Tub: Producer, television/film/videoWalk-in shower   Bathroom Toilet: Standard Bathroom Accessibility: Yes How Accessible: Accessible via walker;Accessible via wheelchair Home Equipment: Shower seat;Cane - single point   Additional Comments: Pt has multiple family members to assist him      Prior Functioning/Environment Level of Independence: Independent with assistive  device(s)        Comments: recent weakness caused more difficulty with ADL. Fall day of admission.    OT Diagnosis: Generalized weakness;Cognitive deficits;Paresis   OT Problem List: Decreased strength;Decreased range of motion;Decreased activity tolerance;Impaired balance (sitting and/or standing);Decreased coordination;Decreased safety awareness;Decreased knowledge of use of DME or AE;Impaired sensation;Impaired tone;Impaired UE functional use   OT Treatment/Interventions: Self-care/ADL training;Therapeutic exercise;Neuromuscular education;DME and/or AE instruction;Therapeutic activities;Cognitive remediation/compensation;Patient/family education;Balance training    OT Goals(Current goals can be found in the care plan section) Acute Rehab OT Goals Patient Stated Goal: to get back to normal OT Goal Formulation: With patient Time For Goal Achievement: 09/08/13 Potential to Achieve Goals: Good  OT Frequency: Min 3X/week   Barriers to D/C:            Co-evaluation              End of Session Equipment Utilized During Treatment: Gait belt;Rolling walker Nurse Communication: Mobility status;Other (comment) (need for bed/chair alarm)  Activity Tolerance: Patient tolerated treatment well Patient left: in chair;with call bell/phone within reach   Time: 0912-0935 OT Time Calculation (min): 23 min Charges:  OT General Charges $OT Visit: 1 Procedure OT Evaluation $Initial OT Evaluation Tier I: 1 Procedure OT Treatments $Self Care/Home Management : 8-22 mins G-Codes:    Isaia Hassell,HILLARY 08/25/2013, 10:28 AM   Luisa DagoHilary Kirkland Figg, OTR/L  [phone] 08/25/2013

## 2013-08-25 NOTE — Progress Notes (Signed)
Physical Therapy Evaluation Patient Details Name: Albert Bond MRN: [id number] DOB: [birth year] Today's Date: 08/25/2013   History of Present Illness  63 yo male with c/o syncope, pt not certain how long he was out for.  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  Cervical canal stenosis.  PMHx:  back pain, alcohol abuse, remote lacunar infarct,   Clinical Impression  Pt is demonstrating unsafe gait with unrealistic expectations of his ability to be mobile in hospital.  Pt was reminded he cannot be up alone, not safe to walk without walker.      Follow Up Recommendations Supervision/Assistance - 24 hour;Home health PT;SNF    Equipment Recommendations  Rolling walker with 5" wheels    Recommendations for Other Services Other (comment) (Pending surgical consult decisions)     Precautions / Restrictions Precautions Precautions: Fall Precaution Comments: Pt requires reminders for his instructions to get help to walk Restrictions Weight Bearing Restrictions: No      Mobility  Bed Mobility Overal bed mobility: Needs Assistance Bed Mobility: Sidelying to Sit   Sidelying to sit: Min guard       General bed mobility comments: cues for sequencing and to compensate for L side weakness  Transfers Overall transfer level: Needs assistance Equipment used: Rolling walker (2 wheeled) Transfers: Sit to/from [employer] Sit to Stand: Min assist Stand pivot transfers: Min assist       General transfer comment: Cues for use of walker to attend to L side and keep in center of walker as he tends to let it get away from him or push away too far in front   Ambulation/Gait Ambulation/Gait assistance: Min assist Ambulation Distance (Feet): 40 Feet Assistive device: Rolling walker (2 wheeled) Gait Pattern/deviations: Step-through pattern;Decreased step length - right;Decreased step length - left;Decreased stance  time - left;Decreased weight shift to left;Ataxic;Drifts right/left;Wide base of support Gait velocity: reduced Gait velocity interpretation: Below normal speed for age/gender General Gait Details: Slow pace with cues to keep from striking objects on L and to stay centered on walker.  Has limited endurance over L side strength and coordination  Stairs            Wheelchair Mobility    Modified Rankin (Stroke Patients Only)       Balance Overall balance assessment: Needs assistance Sitting-balance support: Feet supported Sitting balance-Leahy Scale: Fair   Postural control: Left lateral lean Standing balance support: Bilateral upper extremity supported Standing balance-Leahy Scale: Poor                               Pertinent Vitals/Pain Pain Assessment: No/denies pain    Home Living Family/patient expects to be discharged to:: Private residence Living Arrangements: Other relatives Available Help at Discharge: Family;Available PRN/intermittently Type of Home: House Home Access: Stairs to enter;Ramped entrance Entrance Stairs-Rails: Can reach both Entrance Stairs-Number of Steps: 3 Home Layout: One level Home Equipment: Shower seat;Cane - single point Additional Comments: Pt has multiple family members to assist him    Prior Function Level of Independence: Independent with assistive device(s)               Hand Dominance   Dominant Hand: Right    Extremity/Trunk Assessment   Upper Extremity Assessment: LUE deficits/detail       LUE Deficits / Details: Difficulty elevating arm and grasping with L hand   Lower Extremity  Assessment: Generalized weakness;LLE deficits/detail      Cervical / Trunk Assessment: Kyphotic  Communication   Communication: No difficulties  Cognition Arousal/Alertness: Awake/alert Behavior During Therapy: WFL for tasks assessed/performed Overall Cognitive Status: Within Functional Limits for tasks assessed        Memory: Decreased recall of precautions;Decreased short-term memory              General Comments General comments (skin integrity, edema, etc.): Safety is reduced, limited awareness of L side weakness presenting a risk to fall, needed multiple reminders that both walker and assistance are needed    Exercises        Assessment/Plan    PT Assessment Patient needs continued PT services  PT Diagnosis Abnormality of gait   PT Problem List Decreased strength;Decreased range of motion;Decreased activity tolerance;Decreased balance;Decreased mobility;Decreased coordination;Decreased knowledge of use of DME;Decreased safety awareness;Decreased knowledge of precautions  PT Treatment Interventions DME instruction;Gait training;Stair training;Functional mobility training;Therapeutic activities;Therapeutic exercise;Balance training;Neuromuscular re-education;Patient/family education   PT Goals (Current goals can be found in the Care Plan section) Acute Rehab PT Goals Patient Stated Goal: To get L side working better PT Goal Formulation: With patient Time For Goal Achievement: 09/01/13 Potential to Achieve Goals: Good    Frequency Min 4X/week   Barriers to discharge Inaccessible home environment;Decreased caregiver support      Co-evaluation               End of Session Equipment Utilized During Treatment: Other (comment) (walker) Activity Tolerance: Other (comment) (Limited by LLE weakness) Patient left: in chair;with call bell/phone within reach;Other (comment) (OT heading in to see him) Nurse Communication: Mobility status;Precautions         Time: [id number] PT Time Calculation (min): 25 min   Charges:   PT Evaluation $Initial PT Evaluation Tier I: 1 Procedure PT Treatments $Gait Training: 8-22 mins   PT G Codes:          Ivar DrapeStout, Vitali Seibert E 08/25/2013, 9:51 AM  Samul Dadauth Davion Meara, PT MS Acute Rehab Dept. Number: [phone]

## 2013-08-25 NOTE — Progress Notes (Signed)
[facility] PROGRESS NOTE  Albert Bond [facility] ZOX:[id number] DOB: 1950/03/31 DOA: 08/24/2013 PCP: No PCP Per Patient  Assessment/Plan: 1. L sided weakness secondary to severe cervical spine disease 1. Severe disease with spinal cord involvement 2. Neurosurgery following 3. Neurosurg recs noted for surgical decompression and fusion 4. PT/OT following 2. Chronic Anemia of unclear etiology 1. Stable 2. Continue to monitor 3. Thrombocytopenia 1. Suspect from ETOH intake (see below) 2. Follow for now 3. Remains stable 4. Hx ETOH abuse 1. Pt reported drinking "all my life" 2. Last intake was "a fifth" one day prior to admit 3. On CIWA protocol 4. Family unsure how much ETOH pt actually drinks. Niece reports "he drinks whenever he can get his hands on some [alcohol]" 5. DVT prophylaxis 1. Lovenox subq  Code Status: Full Family Communication: Pt in room, niece and nephew in room Disposition Plan: Pending   Consultants:  Neurosurgery  Procedures:    Antibiotics:   (indicate start date, and stop date if known)  HPI/Subjective: Pt remains without complaints.  Objective: Filed Vitals:   08/25/13 0546 08/25/13 1049 08/25/13 1200 08/25/13 1452  BP: 141/90 147/85 139/88 143/97  Pulse: 63 79 74 78  Temp: 98.4 F (36.9 C)   98.8 F (37.1 C)  TempSrc: Oral   Oral  Resp: 18   18  Height:      Weight: 66.225 kg (146 lb)     SpO2: 100%   100%    Intake/Output Summary (Last 24 hours) at 08/25/13 1553 Last data filed at 08/25/13 1211  Gross per 24 hour  Intake    690 ml  Output   2150 ml  Net  -1460 ml   Filed Weights   08/24/13 0559 08/25/13 0546  Weight: 64.048 kg (141 lb 3.2 oz) 66.225 kg (146 lb)    Exam:   General:  Awake, in nad  Cardiovascular: regular, s1, s2  Respiratory: normal resp effort, no wheezing  Abdomen: soft, nondistended  Musculoskeletal: perfused,no clubbing   Data Reviewed: Basic Metabolic Panel:  Recent Labs Lab  08/24/13 0101 08/24/13 0110 08/24/13 0632  NA 140 139  --   K 4.4 4.2  --   CL 100 101  --   CO2 23  --   --   GLUCOSE 98 99  --   BUN 10 8  --   CREATININE 0.87 1.20 0.84  CALCIUM 9.4  --   --    Liver Function Tests:  Recent Labs Lab 08/24/13 0101  AST 23  ALT 8  ALKPHOS 57  BILITOT 0.5  PROT 7.9  ALBUMIN 4.2   No results found for this basename: LIPASE, AMYLASE,  in the last 168 hours No results found for this basename: AMMONIA,  in the last 168 hours CBC:  Recent Labs Lab 08/24/13 0101 08/24/13 0110 08/24/13 0632  WBC 5.6  --  5.4  NEUTROABS 3.3  --   --   HGB 12.3* 14.3 12.1*  HCT 36.2* 42.0 35.5*  MCV 89.2  --  87.0  PLT 105*  --  106*   Cardiac Enzymes:  Recent Labs Lab 08/24/13 0101 08/24/13 0751 08/24/13 1302 08/24/13 1904  CKTOTAL 392*  --   --   --   TROPONINI <0.30 <0.30 <0.30 <0.30   BNP (last 3 results) No results found for this basename: PROBNP,  in the last 8760 hours CBG: No results found for this basename: GLUCAP,  in the last 168 hours  No results found for this  or any previous visit (from the past 240 hour(s)).   Studies: Ct Head Wo Contrast  08/24/2013   CLINICAL DATA:  Alcohol intoxication.  Unable to ambulate.  EXAM: CT HEAD WITHOUT CONTRAST  CT CERVICAL SPINE WITHOUT CONTRAST  TECHNIQUE: Multidetector CT imaging of the head and cervical spine was performed following the standard protocol without intravenous contrast. Multiplanar CT image reconstructions of the cervical spine were also generated.  COMPARISON:  08/07/2003 head CT  FINDINGS: CT HEAD FINDINGS  Skull and Sinuses:Negative for fracture or destructive process. The mastoids, middle ears, and imaged paranasal sinuses are clear.  Orbits: No acute abnormality.  Brain: No evidence of acute abnormality, such as acute infarction, hemorrhage, hydrocephalus, or mass lesion/mass effect. There are new but discrete and likely remote lacunar infarcts involving the right caudate head and  right caudate body/ corona radiata.  Brain atrophy which is generalized and progressive from 2005.  CT CERVICAL SPINE FINDINGS  No evidence of acute fracture or traumatic subluxation. No gross cervical canal hematoma or prevertebral edema.  There is diffuse, advanced degenerative disc narrowing and endplate irregularity. When superimposed on a congenitally narrow spinal canal there is diffuse canal stenosis. Central disc herniation at C3-4 results in severe spinal canal stenosis with cord compression. Uncovertebral spurring is diffuse and bulky, with bilateral foraminal stenosis most progressed at C3-4 and C4-5. Degenerative ankylosis has formed across the C5-6 disc.  IMPRESSION: 1. No acute intracranial or cervical spine injury identified. 2. 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 3. Diffuse cervical canal stenosis, as above. Stenosis is severe at the level of C3-4, with cord compression. 4. Generalized brain atrophy, progressive from 2005.   Electronically Signed   By: Tiburcio Pea M.D.   On: 08/24/2013 01:48   Ct Cervical Spine Wo Contrast  08/24/2013   CLINICAL DATA:  Alcohol intoxication.  Unable to ambulate.  EXAM: CT HEAD WITHOUT CONTRAST  CT CERVICAL SPINE WITHOUT CONTRAST  TECHNIQUE: Multidetector CT imaging of the head and cervical spine was performed following the standard protocol without intravenous contrast. Multiplanar CT image reconstructions of the cervical spine were also generated.  COMPARISON:  08/07/2003 head CT  FINDINGS: CT HEAD FINDINGS  Skull and Sinuses:Negative for fracture or destructive process. The mastoids, middle ears, and imaged paranasal sinuses are clear.  Orbits: No acute abnormality.  Brain: No evidence of acute abnormality, such as acute infarction, hemorrhage, hydrocephalus, or mass lesion/mass effect. There are new but discrete and likely remote lacunar infarcts involving the right caudate head and right caudate body/ corona  radiata.  Brain atrophy which is generalized and progressive from 2005.  CT CERVICAL SPINE FINDINGS  No evidence of acute fracture or traumatic subluxation. No gross cervical canal hematoma or prevertebral edema.  There is diffuse, advanced degenerative disc narrowing and endplate irregularity. When superimposed on a congenitally narrow spinal canal there is diffuse canal stenosis. Central disc herniation at C3-4 results in severe spinal canal stenosis with cord compression. Uncovertebral spurring is diffuse and bulky, with bilateral foraminal stenosis most progressed at C3-4 and C4-5. Degenerative ankylosis has formed across the C5-6 disc.  IMPRESSION: 1. No acute intracranial or cervical spine injury identified. 2. 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 3. Diffuse cervical canal stenosis, as above. Stenosis is severe at the level of C3-4, with cord compression. 4. Generalized brain atrophy, progressive from 2005.   Electronically Signed   By: Tiburcio Pea M.D.   On: 08/24/2013  01:48   Mr Sherrin Daisy Contrast  08/24/2013   ADDENDUM REPORT: 08/24/2013 10:47  ADDENDUM: Critical Value/emergent results were called by telephone at the time of interpretation on 08/24/2013 at 10:40 am to Dr. Rhona Leavens who verbally acknowledged these results.   Electronically Signed   By: Augusto Gamble M.D.   On: 08/24/2013 10:47   08/24/2013   CLINICAL DATA:  63 year old male with syncope and left side weakness. Advanced cervical spine degeneration with strong evidence of spinal stenosis by CT. Initial encounter.  EXAM: MRI HEAD WITHOUT CONTRAST  MRI CERVICAL SPINE WITHOUT CONTRAST  TECHNIQUE: Multiplanar, multiecho pulse sequences of the brain and surrounding structures, and cervical spine, to include the craniocervical junction and cervicothoracic junction, were obtained without intravenous contrast.  COMPARISON:  Head and cervical spine CT 0124 hr the same day.  FINDINGS: MRI HEAD FINDINGS   Cerebral volume is within normal limits for age. No restricted diffusion to suggest acute infarction. No midline shift, mass effect, evidence of mass lesion, ventriculomegaly, extra-axial collection or acute intracranial hemorrhage. Cervicomedullary junction and pituitary are within normal limits.  Loss of the distal left vertebral artery flow void at the skullbase (series 5, image 3). Other Major intracranial vascular flow voids are preserved, with a degree of generalized intracranial artery dolichoectasia.  Chronic hemorrhagic lacunar infarct extending from the right corona radiata to the lentiform nuclei. Multiple chronic lacunar infarcts of the right caudate nucleus, the largest with chronic blood products. Additional chronic micro hemorrhage in the remaining bilateral deep gray matter nuclei. Chronic micro hemorrhages occasionally noted elsewhere in the brain. Additional nonspecific cerebral white matter T2 and FLAIR hyperintensity. No cortical encephalomalacia identified. Cerebellum within normal limits.  Visible internal auditory structures appear normal. Negative mastoids and paranasal sinuses except for minimal mucosal thickening in the right maxillary sinus. Visualized orbit soft tissues are within normal limits. Visualized scalp soft tissues are within normal limits. Hyperostosis of the calvarium with normal bone marrow signal.  MRI CERVICAL SPINE FINDINGS  Stable cervical vertebral height and alignment with straightening and mild reversal of lordosis. Sclerotic odontoid process on the comparison does demonstrate evidence of mild marrow edema, favor degenerative. There is also low level increased STIR signal between the odontoid tip an the [place] non. The tectorial membrane appears intact. No other abnormal ligamentous signal identified in the cervical spine.  Small volume layering secretions in the pharynx. The cervical left vertebral artery flow void is preserved, but appears diminutive to that on the  right.  Abnormal cervical spinal cord signal from C3-C4 to C4-C5, and again at the C5-C6 level. Decreased cord volume, No evidence of hemorrhage within the spinal cord, and these changes are most compatible with myelomalacia.  C2-C3: Moderate facet hypertrophy on the right. Moderate bilateral C3 foraminal stenosis.  C3-C4: Multifactorial severe spinal stenosis (AP thecal sac reduced to 3-4 millimeters) with spinal cord compression related to right eccentric bulky circumferential disc osteophyte complex. Majority of the posterior disc component is non calcified as seen on the comparison. Superimposed facet and uncovertebral hypertrophy with severe C4 biforaminal stenosis.  C4-C5: Multifactorial moderate to severe spinal stenosis (AP thecal sac reduced to 4 millimeters AP) with spinal cord compression and bulky circumferential disc osteophyte complex. Partially calcified posterior component of disc as seen on the comparison. Uncovertebral hypertrophy with severe C5 biforaminal stenosis.  C5-C6: Circumferential disc osteophyte complex, but the degree of spinal stenosis abates at this level, borderline to mild. Mild ligament flavum hypertrophy. Severe C6 biforaminal stenosis.  C6-C7: Circumferential disc osteophyte  complex. Ligament flavum hypertrophy, but no spinal stenosis. Severe C7 biforaminal stenosis.  C7-T1: Trace anterolisthesis. Severe bilateral facet hypertrophy. Moderate ligament flavum hypertrophy. Uncovertebral hypertrophy. No spinal stenosis. Severe C8 biforaminal stenosis.  No upper thoracic spinal stenosis.  IMPRESSION: 1. Severe degenerative cervical spinal stenosis at C3-C4 and C4-C5. Spinal cord compression at both levels with cord volume loss and abnormal signal most suggestive of myelomalacia. Borderline to mild spinal stenosis at C5-C6 were there is also evidence of abnormal cord signal favored to be chronic. No evidence of spinal cord hemorrhage. 2. Diffuse severe cervical foraminal stenosis. 3.  No acute infarct. 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 4. Poor flow or occlusion of the distal left vertebral artery at the skullbase, suspect chronic given the absence of acute posterior fossa signal changes.  Electronically Signed: By: Augusto GambleLee  Hall M.D. On: 08/24/2013 10:26   Mr Cervical Spine Wo Contrast  08/24/2013   ADDENDUM REPORT: 08/24/2013 10:47  ADDENDUM: Critical Value/emergent results were called by telephone at the time of interpretation on 08/24/2013 at 10:40 am to Dr. Rhona Leavenshiu who verbally acknowledged these results.   Electronically Signed   By: Augusto GambleLee  Hall M.D.   On: 08/24/2013 10:47   08/24/2013   CLINICAL DATA:  63 year old male with syncope and left side weakness. Advanced cervical spine degeneration with strong evidence of spinal stenosis by CT. Initial encounter.  EXAM: MRI HEAD WITHOUT CONTRAST  MRI CERVICAL SPINE WITHOUT CONTRAST  TECHNIQUE: Multiplanar, multiecho pulse sequences of the brain and surrounding structures, and cervical spine, to include the craniocervical junction and cervicothoracic junction, were obtained without intravenous contrast.  COMPARISON:  Head and cervical spine CT 0124 hr the same day.  FINDINGS: MRI HEAD FINDINGS  Cerebral volume is within normal limits for age. No restricted diffusion to suggest acute infarction. No midline shift, mass effect, evidence of mass lesion, ventriculomegaly, extra-axial collection or acute intracranial hemorrhage. Cervicomedullary junction and pituitary are within normal limits.  Loss of the distal left vertebral artery flow void at the skullbase (series 5, image 3). Other Major intracranial vascular flow voids are preserved, with a degree of generalized intracranial artery dolichoectasia.  Chronic hemorrhagic lacunar infarct extending from the right corona radiata to the lentiform nuclei. Multiple chronic lacunar infarcts of the right caudate nucleus,  the largest with chronic blood products. Additional chronic micro hemorrhage in the remaining bilateral deep gray matter nuclei. Chronic micro hemorrhages occasionally noted elsewhere in the brain. Additional nonspecific cerebral white matter T2 and FLAIR hyperintensity. No cortical encephalomalacia identified. Cerebellum within normal limits.  Visible internal auditory structures appear normal. Negative mastoids and paranasal sinuses except for minimal mucosal thickening in the right maxillary sinus. Visualized orbit soft tissues are within normal limits. Visualized scalp soft tissues are within normal limits. Hyperostosis of the calvarium with normal bone marrow signal.  MRI CERVICAL SPINE FINDINGS  Stable cervical vertebral height and alignment with straightening and mild reversal of lordosis. Sclerotic odontoid process on the comparison does demonstrate evidence of mild marrow edema, favor degenerative. There is also low level increased STIR signal between the odontoid tip an the [place] non. The tectorial membrane appears intact. No other abnormal ligamentous signal identified in the cervical spine.  Small volume layering secretions in the pharynx. The cervical left vertebral artery flow void is preserved, but appears diminutive to that on the right.  Abnormal cervical spinal cord signal from C3-C4 to C4-C5, and again at the  C5-C6 level. Decreased cord volume, No evidence of hemorrhage within the spinal cord, and these changes are most compatible with myelomalacia.  C2-C3: Moderate facet hypertrophy on the right. Moderate bilateral C3 foraminal stenosis.  C3-C4: Multifactorial severe spinal stenosis (AP thecal sac reduced to 3-4 millimeters) with spinal cord compression related to right eccentric bulky circumferential disc osteophyte complex. Majority of the posterior disc component is non calcified as seen on the comparison. Superimposed facet and uncovertebral hypertrophy with severe C4 biforaminal stenosis.   C4-C5: Multifactorial moderate to severe spinal stenosis (AP thecal sac reduced to 4 millimeters AP) with spinal cord compression and bulky circumferential disc osteophyte complex. Partially calcified posterior component of disc as seen on the comparison. Uncovertebral hypertrophy with severe C5 biforaminal stenosis.  C5-C6: Circumferential disc osteophyte complex, but the degree of spinal stenosis abates at this level, borderline to mild. Mild ligament flavum hypertrophy. Severe C6 biforaminal stenosis.  C6-C7: Circumferential disc osteophyte complex. Ligament flavum hypertrophy, but no spinal stenosis. Severe C7 biforaminal stenosis.  C7-T1: Trace anterolisthesis. Severe bilateral facet hypertrophy. Moderate ligament flavum hypertrophy. Uncovertebral hypertrophy. No spinal stenosis. Severe C8 biforaminal stenosis.  No upper thoracic spinal stenosis.  IMPRESSION: 1. Severe degenerative cervical spinal stenosis at C3-C4 and C4-C5. Spinal cord compression at both levels with cord volume loss and abnormal signal most suggestive of myelomalacia. Borderline to mild spinal stenosis at C5-C6 were there is also evidence of abnormal cord signal favored to be chronic. No evidence of spinal cord hemorrhage. 2. Diffuse severe cervical foraminal stenosis. 3. No acute infarct. 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 4. Poor flow or occlusion of the distal left vertebral artery at the skullbase, suspect chronic given the absence of acute posterior fossa signal changes.  Electronically Signed: By: Augusto Gamble M.D. On: 08/24/2013 10:26    Scheduled Meds: . [START ON 08/26/2013]  ceFAZolin (ANCEF) IV  2 g Intravenous On Call to OR  . enoxaparin (LOVENOX) injection  40 mg Subcutaneous Q24H  . folic acid  1 mg Oral Daily  . LORazepam  0-4 mg Oral Q6H   Followed by  . [START ON 08/26/2013] LORazepam  0-4 mg Oral Q12H  . multivitamin with  minerals  1 tablet Oral Daily  . nicotine  14 mg Transdermal Daily  . pneumococcal 23 valent vaccine  0.5 mL Intramuscular Tomorrow-1000  . sodium chloride  3 mL Intravenous Q12H  . sodium chloride  3 mL Intravenous Q12H  . thiamine  100 mg Oral Daily   Continuous Infusions:    Principal Problem:   Left-sided weakness Active Problems:   Syncope   Anemia  Time spent:  Cletis Clack K  [facility] Pager [phone]. If 7PM-7AM, please contact night-coverage at [email or web address], password [email or web address] 08/25/2013, 3:53 PM  LOS: 1 day

## 2013-08-25 NOTE — Progress Notes (Signed)
No issues overnight. Pt reports stable primarily left-sided weakness.  EXAM:  BP 143/97  Pulse 78  Temp(Src) 98.8 F (37.1 C) (Oral)  Resp 18  Ht 5\' 7"  (1.702 m)  Wt 66.225 kg (146 lb)  BMI 22.86 kg/m2  SpO2 100%  Awake, alert, oriented  Speech fluent, appropriate  CN grossly intact  Mild BUE L>R paresis Proximal BLE weakness  IMPRESSION:  63 y.o. 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 All his questions were answered. He again provided consent to proceed.

## 2013-08-26 MED ORDER — PNEUMOCOCCAL VAC POLYVALENT 25 MCG/0.5ML IJ INJ
0.5000 mL | INJECTION | Freq: Once | INTRAMUSCULAR | Status: DC
  Filled 2013-08-26: qty 0.5

## 2013-08-26 MED ORDER — CEFAZOLIN SODIUM-DEXTROSE 2-3 GM-% IV SOLR
2.0000 g | INTRAVENOUS | Status: AC
  Filled 2013-08-26: qty 50

## 2013-08-26 NOTE — Progress Notes (Signed)
Physical Therapy Treatment Patient Details Name: Albert Bond MRN: [id number] DOB: 12-30-1950 Today's Date: 08/26/2013    History of Present Illness 63 yo male with c/o syncope, pt not certain how long he was out for.  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  Cervical canal stenosis.  PMHx:  back pain, alcohol abuse, remote lacunar infarct, Hx of ETOH use. On CIWA protocol.. Surgery postponed until Monday?    PT Comments    Patient with improved mobility today increasing ambulation distance. Pt fatigues quickly and demonstrates impaired muscular endurance most notably on left side as noted by difficulty advancing LLE and more reliance on UEs for stability towards end of ambulation distance. Tremoring noted in BLEs during standing there ex. Pt continues to have impaired safety awareness putting pt at increased risk for falls. Education provided on importance of having assistance to get to bathroom to reduce fall risk. Surgery postponed until next week? Will update discharge recommendations post surgery.   Follow Up Recommendations  Other (comment);Supervision/Assistance - 24 hour (TBD pending mobility post surgery.)     Equipment Recommendations  Rolling walker with 5" wheels    Recommendations for Other Services       Precautions / Restrictions Precautions Precautions: Fall Restrictions Weight Bearing Restrictions: No    Mobility  Bed Mobility Overal bed mobility: Needs Assistance Bed Mobility: Rolling;Sidelying to Sit Rolling: Min guard Sidelying to sit: Min guard       General bed mobility comments: Able to roll and sit up without physical assist, HOB elevated and use of rails for support. Able to return to supine to [facility] flat, no rails. Min guard for safety.  Transfers Overall transfer level: Needs assistance Equipment used: None Transfers: Sit to/from Stand Sit to Stand: Min guard Stand pivot  transfers: Min guard       General transfer comment: VC for hand placement and anterior translation.  Ambulation/Gait Ambulation/Gait assistance: Min assist Ambulation Distance (Feet): 80 Feet Assistive device: Rolling walker (2 wheeled) Gait Pattern/deviations: Step-through pattern;Scissoring;Trunk flexed;Decreased stride length;Narrow base of support Gait velocity: reduced   General Gait Details: Pt able to maintain proximity within RW today. Increased WB through UEs for stability. VC to widen BoS as pt ambulating with scrissoring like gait pattern and narrow BoS like walking on a tight rope. Limited endurance noted LLE. Difficulty advancing BLEs towards end of gait due to fatigue.   Stairs            Wheelchair Mobility    Modified Rankin (Stroke Patients Only)       Balance Overall balance assessment: Needs assistance Sitting-balance support: Feet supported Sitting balance-Leahy Scale: Good Sitting balance - Comments: Able to fix sock by leaning outside BoS without LOB.   Standing balance support: Bilateral upper extremity supported Standing balance-Leahy Scale: Fair Standing balance comment: Able to ambulate short distances within room with Min guard assist for safety - unsteady with slow gait. Able to withstand external perturbations in standing with wide boS. For longer gait distances, pt requires Bue support on Rw.                    Cognition Arousal/Alertness: Awake/alert Behavior During Therapy: WFL for tasks assessed/performed Overall Cognitive Status: No family/caregiver present to determine baseline cognitive functioning Area of Impairment: Safety/judgement     Memory: Decreased short-term memory   Safety/Judgement: Decreased awareness of safety;Decreased awareness of deficits  Exercises General Exercises - Lower Extremity Heel Raises: Both;10 reps;Standing (x2 sets with UE support.) Other Exercises Other Exercises: Sit <-> stand  x10 from low bed height without use of UEs, VC to decrease speed to emphasize eccentric quad control.  Other Exercises: rolled putty into cylinder for LUE x 5 Other Exercises: squeeze ball 10x's x 2 sets for RUE Other Exercises: worked on pinch/opposition with each finger and thumb on R; 3 sets of 3 Other Exercises: written HEP given    General Comments        Pertinent Vitals/Pain Pain Assessment: No/denies pain    Home Living                      Prior Function            PT Goals (current goals can now be found in the care plan section) Progress towards PT goals: Progressing toward goals    Frequency  Min 4X/week    PT Plan Current plan remains appropriate    Co-evaluation             End of Session Equipment Utilized During Treatment: Gait belt Activity Tolerance: Patient tolerated treatment well Patient left: in bed;with call bell/phone within reach;with bed alarm set     Time: [id number] PT Time Calculation (min): 24 min  Charges:  $Gait Training: 8-22 mins $Therapeutic Activity: 8-22 mins                    G CodesAlvie Heidelberg:      Folan, Kirtis Challis A 08/26/2013, 5:11 PM Alvie HeidelbergShauna Folan, PT, DPT [phone]

## 2013-08-26 NOTE — Progress Notes (Signed)
No issues overnight. Pt has no current complaints.  EXAM:  BP 141/93  Pulse 65  Temp(Src) 98.2 F (36.8 C) (Oral)  Resp 18  Ht 5\' 7"  (1.702 m)  Wt 66.455 kg (146 lb 8.1 oz)  BMI 22.94 kg/m2  SpO2 99%  Awake, alert, oriented  Speech fluent, appropriate  CN grossly intact  Mild UE and L>R proximal LE weakness is stable  IMPRESSION:  63 y.o. male with cervical myelopathy due to C3-4 C4-5 disc herniation   PLAN: - ACDF at C3-5 will be postponed till early next week - Spoke with hospitalist service, pt is otherwise medically stable. Will transfer to my service.

## 2013-08-26 NOTE — Progress Notes (Signed)
[facility] PROGRESS NOTE  Albert Bond [facility] XBJ:[id number] DOB: March 02, 1950 DOA: 08/24/2013 PCP: No PCP Per Patient  Assessment/Plan: 1. L sided weakness secondary to severe cervical spine disease 1. Severe disease with spinal cord involvement 2. Neurosurgery following 3. Neurosurg recs noted for surgical decompression and fusion later today 2. Chronic Anemia of unclear etiology 1. Stable 2. No need for tx 3. Continue to monitor 3. Thrombocytopenia 1. Suspect from ETOH intake (see below) 2. Follow for now 3. Remains stable 4. Hx ETOH abuse 1. Pt reported drinking "all my life" 2. Last intake was "a fifth" one day prior to admit 3. On CIWA protocol 4. Family unsure how much ETOH pt actually drinks. Niece reports "he drinks whenever he can get his hands on some [alcohol]" 5. Thus far, no evidence of DT's 5. DVT prophylaxis 1. Lovenox subq  Code Status: Full Family Communication: Pt in room Disposition Plan: Pending   Consultants:  Neurosurgery  Procedures:    Antibiotics:   (indicate start date, and stop date if known)  HPI/Subjective: Pt remains without complaints.No acute events noted overnight  Objective: Filed Vitals:   08/25/13 1200 08/25/13 1452 08/25/13 2148 08/26/13 0504  BP: 139/88 143/97 164/90 128/81  Pulse: 74 78 72 57  Temp:  98.8 F (37.1 C) 99 F (37.2 C) 97.8 F (36.6 C)  TempSrc:  Oral Oral Oral  Resp:  18 18 18   Height:      Weight:    66.455 kg (146 lb 8.1 oz)  SpO2:  100% 98% 100%    Intake/Output Summary (Last 24 hours) at 08/26/13 1412 Last data filed at 08/26/13 0505  Gross per 24 hour  Intake    243 ml  Output    400 ml  Net   -157 ml   Filed Weights   08/24/13 0559 08/25/13 0546 08/26/13 0504  Weight: 64.048 kg (141 lb 3.2 oz) 66.225 kg (146 lb) 66.455 kg (146 lb 8.1 oz)    Exam:   General:  Awake, in nad  Cardiovascular: regular, s1, s2  Respiratory: normal resp effort, no wheezing  Abdomen: soft,  nondistended  Musculoskeletal: perfused,no clubbing   Data Reviewed: Basic Metabolic Panel:  Recent Labs Lab 08/24/13 0101 08/24/13 0110 08/24/13 0632  NA 140 139  --   K 4.4 4.2  --   CL 100 101  --   CO2 23  --   --   GLUCOSE 98 99  --   BUN 10 8  --   CREATININE 0.87 1.20 0.84  CALCIUM 9.4  --   --    Liver Function Tests:  Recent Labs Lab 08/24/13 0101  AST 23  ALT 8  ALKPHOS 57  BILITOT 0.5  PROT 7.9  ALBUMIN 4.2   No results found for this basename: LIPASE, AMYLASE,  in the last 168 hours No results found for this basename: AMMONIA,  in the last 168 hours CBC:  Recent Labs Lab 08/24/13 0101 08/24/13 0110 08/24/13 0632  WBC 5.6  --  5.4  NEUTROABS 3.3  --   --   HGB 12.3* 14.3 12.1*  HCT 36.2* 42.0 35.5*  MCV 89.2  --  87.0  PLT 105*  --  106*   Cardiac Enzymes:  Recent Labs Lab 08/24/13 0101 08/24/13 0751 08/24/13 1302 08/24/13 1904  CKTOTAL 392*  --   --   --   TROPONINI <0.30 <0.30 <0.30 <0.30   BNP (last 3 results) No results found for this basename: PROBNP,  in the last 8760 hours CBG: No results found for this basename: GLUCAP,  in the last 168 hours  No results found for this or any previous visit (from the past 240 hour(s)).   Studies: No results found.  Scheduled Meds: .  ceFAZolin (ANCEF) IV  2 g Intravenous On Call to OR  . folic acid  1 mg Oral Daily  . LORazepam  0-4 mg Oral Q12H  . multivitamin with minerals  1 tablet Oral Daily  . nicotine  14 mg Transdermal Daily  . pneumococcal 23 valent vaccine  0.5 mL Intramuscular Once  . sodium chloride  3 mL Intravenous Q12H  . sodium chloride  3 mL Intravenous Q12H  . thiamine  100 mg Oral Daily   Continuous Infusions:    Principal Problem:   Left-sided weakness Active Problems:   Syncope   Anemia  Time spent: 35min  Albert Bond K  [facility] Pager [phone]. If 7PM-7AM, please contact night-coverage at [email or web address], password [email or web address] 08/26/2013, 2:12 PM   LOS: 2 days

## 2013-08-26 NOTE — Progress Notes (Signed)
Occupational Therapy Treatment Patient Details Name: Albert Bond MRN: [id number] DOB: 04/23/1950 Today's Date: 08/26/2013    History of present illness 63 yo male with c/o syncope, pt not certain how long he was out for.  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  Cervical canal stenosis.  PMHx:  back pain, alcohol abuse, remote lacunar infarct, Hx of ETOH use. On CIWA protocol.   OT comments  Pt return demonstrated exercises; written HEP given.  Explained rationale of working on L extension prior to flexion as this is weakest muscle group.  Pt's sx now pending next week.  Follow Up Recommendations  Home health OT;Supervision/Assistance - 24 hour    Equipment Recommendations   (RW)    Recommendations for Other Services      Precautions / Restrictions Precautions Precautions: Fall Restrictions Weight Bearing Restrictions: No       Mobility Bed Mobility       Sidelying to sit: Min assist       General bed mobility comments: light assistance for sitting up (trunk)  Transfers       Sit to Stand: Min guard Stand pivot transfers: Min guard       General transfer comment: cues for hand placement    Balance   Sitting-balance support: Feet supported Sitting balance-Leahy Scale: Fair     Standing balance support: Bilateral upper extremity supported Standing balance-Leahy Scale: Poor                     ADL       Grooming: Oral care;Wash/dry hands;Min guard;Standing                                 General ADL Comments: used built up foam on toothbrush and able to perform grooming with min guard for safety      Vision                     Perception     Praxis      Cognition   Behavior During Therapy: WFL for tasks assessed/performed Overall Cognitive Status: No family/caregiver present to determine baseline cognitive functioning       Memory:  Decreased short-term memory               Extremity/Trunk Assessment               Exercises Other Exercises Other Exercises: "prayer stretch" into composite extension Other Exercises: rolled putty into cylinder for LUE x 5 Other Exercises: squeeze ball 10x's x 2 sets for RUE Other Exercises: worked on pinch/opposition with each finger and thumb on R; 3 sets of 3 Other Exercises: written HEP given; guidelines given for reps/# times to perform. Pt is very motivated   Shoulder Instructions       General Comments      Pertinent Vitals/ Pain       Pain Assessment: No/denies pain  Home Living                                          Prior Functioning/Environment              Frequency Min 3X/week     Progress Toward Goals  OT Goals(current goals can now be  found in the care plan section)        Plan      Co-evaluation                 End of Session Equipment Utilized During Treatment: Gait belt;Rolling walker   Activity Tolerance Patient tolerated treatment well   Patient Left in bed;in CPM;with bed alarm set   Nurse Communication          Time: [phone] OT Time Calculation (min): 48 min  Charges: OT General Charges $OT Visit: 1 Procedure OT Treatments $Self Care/Home Management : 8-22 mins $Therapeutic Exercise: 23-37 mins  Albert Bond 08/26/2013, 3:44 PM  Albert Bond, OTR/L [phone] 08/26/2013

## 2013-08-27 MED ORDER — PNEUMOCOCCAL VAC POLYVALENT 25 MCG/0.5ML IJ INJ
0.5000 mL | INJECTION | INTRAMUSCULAR | Status: DC
  Filled 2013-08-27: qty 0.5

## 2013-08-27 MED ORDER — ZOLPIDEM TARTRATE 5 MG PO TABS
5.0000 mg | ORAL_TABLET | Freq: Every evening | ORAL | Status: DC | PRN
  Administered 2013-08-27 – 2013-08-31 (×4): 5 mg via ORAL
  Filled 2013-08-27 (×4): qty 1

## 2013-08-27 NOTE — Progress Notes (Signed)
Patient ID: Albert FellsJames David Bond, male   DOB: 02/23/1950, 63 y.o.   MRN: [id number] Doing better increased feeling and increased sensation and function his hands and legs  Awaiting anterior cervical discectomy and fusion possibly on Monday  Continue PT OT

## 2013-08-28 NOTE — Progress Notes (Signed)
Patient ID: Albert Bond, male   DOB: Dec 02, 1950, 63 y.o.   MRN: [id number] Neurologically stable  Cervical myelopathy awaiting ACDF made him n.p.o. after midnight

## 2013-08-29 MED ORDER — PNEUMOCOCCAL VAC POLYVALENT 25 MCG/0.5ML IJ INJ
0.5000 mL | INJECTION | INTRAMUSCULAR | Status: DC
  Filled 2013-08-29: qty 0.5

## 2013-08-29 NOTE — Progress Notes (Signed)
No issues overnight. Pt reports subjective improvement in strength. Able to walk with rolling walker with PT.  EXAM:  BP 111/68  Pulse 65  Temp(Src) 98.2 F (36.8 C) (Oral)  Resp 15  Ht  (1.702 m)  Wt 66.214 kg (145 lb 15.6 oz)  BMI 22.86 kg/m2  SpO2 100%  Awake, alert, oriented  Speech fluent, appropriate  CN grossly intact  Stable mild UE and LE paresis   IMPRESSION:  63 y.o. male with cervical myelopathy  PLAN: - OR tomorrow for C3-4, C4-5 ACDF - NPO p MN

## 2013-08-29 NOTE — Progress Notes (Signed)
Occupational Therapy Treatment Patient Details Name: Albert Bond MRN: [id number] DOB: 06-29-1950 Today's Date: 08/29/2013    History of present illness 63 yo male with c/o syncope, pt not certain how long he was out for.  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  Cervical canal stenosis.  PMHx:  back pain, alcohol abuse, remote lacunar infarct, Hx of ETOH use. On CIWA protocol.. ACDF 08/29/13.   OT comments  Pt reluctant to participate in ADL due to modesty. Able to perform with supervision seated and standing at sink.  Pt appreciative at the end of session. Pt reports being faithful to his home exercise program over the weekend. For ACDF today per chart.   Follow Up Recommendations  Home health OT;Supervision/Assistance - 24 hour    Equipment Recommendations       Recommendations for Other Services      Precautions / Restrictions Precautions Precautions: Fall Precaution Comments: encouraged  cervical precautions, log rolling       Mobility Bed Mobility Overal bed mobility: Needs Assistance Bed Mobility: Rolling;Sidelying to Sit Rolling: Supervision Sidelying to sit: Supervision       General bed mobility comments: bed flat, no rail use  Transfers   Equipment used: Rolling walker (2 wheeled) Transfers: Sit to/from Stand Sit to Stand: Min guard;Supervision (min guard from bed, supervision from chair)         General transfer comment: VCs for hand placement     Balance                                   ADL Overall ADL's : Needs assistance/impaired     Grooming: Oral care;Supervision/safety;Standing;Wash/dry hands;Wash/dry face Grooming Details (indicate cue type and reason): pt able to squeeze washcloth out, used build up on toothbrush Upper Body Bathing: Set up;Sitting   Lower Body Bathing: Supervison/ safety;Sit to/from stand   Upper Body Dressing : Set up;Sitting   Lower  Body Dressing: Supervision/safety;Sit to/from stand   Toilet Transfer: Min guard   Toileting- Architect and Hygiene: Supervision/safety;Sit to/from stand       Functional mobility during ADLs: Min guard;Rolling walker General ADL Comments: Pt very modest, required encouragement to work on bathing and dressing with OT, but then was appreciative of help.      Vision                     Perception     Praxis      Cognition   Behavior During Therapy: [facility] for tasks assessed/performed Overall Cognitive Status: No family/caregiver present to determine baseline cognitive functioning Area of Impairment: Safety/judgement          Safety/Judgement: Decreased awareness of safety;Decreased awareness of deficits          Extremity/Trunk Assessment               Exercises     Shoulder Instructions       General Comments      Pertinent Vitals/ Pain       Pain Assessment: No/denies pain  Home Living                                          Prior Functioning/Environment  Frequency Min 3X/week     Progress Toward Goals  OT Goals(current goals can now be found in the care plan section)  Progress towards OT goals: Progressing toward goals  Acute Rehab OT Goals Patient Stated Goal: to get back to normal  Plan Discharge plan remains appropriate    Co-evaluation                 End of Session Equipment Utilized During Treatment: Gait belt;Rolling walker   Activity Tolerance Patient tolerated treatment well   Patient Left  (with PT)   Nurse Communication          Time: 1610-9604 OT Time Calculation (min): 40 min  Charges: OT General Charges $OT Visit: 1 Procedure OT Treatments $Self Care/Home Management : 38-52 mins  Evern Bio 08/29/2013, 9:42 AM [phone]

## 2013-08-29 NOTE — Progress Notes (Signed)
Physical Therapy Treatment Patient Details Name: Albert Bond MRN: [id number] DOB: [birth year] Today's Date: 08/29/2013    History of Present Illness 63 yo male with c/o syncope, pt not certain how long he was out for.  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  Cervical canal stenosis.  PMHx:  back pain, alcohol abuse, remote lacunar infarct, Hx of ETOH use. On CIWA protocol.. ACDF 08/29/13.    PT Comments    Pt with increased gait and endurance today but noted continued difficulty with LLE gross motor control. Pt educated for cervical precautions with mobility and transfers as well as DME use with gait and reliance on RW at this time. Will continue to follow as pt planned for ACDF today.   Follow Up Recommendations  Home health PT;Supervision for mobility/OOB     Equipment Recommendations  Rolling walker with 5" wheels    Recommendations for Other Services       Precautions / Restrictions Precautions Precautions: Fall Precaution Comments: encouraged  cervical precautions, log rolling    Mobility  Bed Mobility Overal bed mobility: Needs Assistance Bed Mobility: Rolling;Sit to Sidelying Rolling: Supervision Sidelying to sit: Supervision     Sit to sidelying: Supervision General bed mobility comments: bed flat, cues for precautions, pt has tendency to lead with head and needed cues to not strain neck  Transfers Overall transfer level: Needs assistance Equipment used: Rolling walker (2 wheeled) Transfers: Sit to/from Stand Sit to Stand: Supervision         General transfer comment: supervision for safety with cues for hand placement  Ambulation/Gait Ambulation/Gait assistance: Min guard Ambulation Distance (Feet): 300 Feet Assistive device: Rolling walker (2 wheeled);None Gait Pattern/deviations: Narrow base of support;Step-through pattern   Gait velocity interpretation: Below normal speed for  age/gender General Gait Details: Pt able to perform increased gait distance with reliance on RW today with maintained narrow BOS with heel to toe walking pattern. Without RW grossly 30' pt with very slow gait, scissoring, difficulty advancing LLE and incresed gross motor control   Stairs            Wheelchair Mobility    Modified Rankin (Stroke Patients Only)       Balance Overall balance assessment: Needs assistance   Sitting balance-Leahy Scale: Good       Standing balance-Leahy Scale: Fair                      Cognition Arousal/Alertness: Awake/alert Behavior During Therapy: WFL for tasks assessed/performed Overall Cognitive Status: No family/caregiver present to determine baseline cognitive functioning Area of Impairment: Safety/judgement     Memory: Decreased recall of precautions   Safety/Judgement: Decreased awareness of safety;Decreased awareness of deficits          Exercises General Exercises - Lower Extremity Long Arc Quad: AROM;Seated;Both;15 reps Hip ABduction/ADduction: AROM;Both;15 reps;Supine Hip Flexion/Marching: AROM;Seated;Both;20 reps Toe Raises: AROM;Seated;Both;20 reps Heel Raises: AROM;Seated;Both;20 reps Other Exercises Other Exercises: performed heel to chin bil x 10 with decreased control LLE with gross motor    General Comments        Pertinent Vitals/Pain Pain Assessment: No/denies pain    Home Living                      Prior Function            PT Goals (current goals can now be found in the care plan section) Acute Rehab  PT Goals Patient Stated Goal: to get back to normal Progress towards PT goals: Progressing toward goals    Frequency  Min 3X/week    PT Plan Discharge plan needs to be updated;Frequency needs to be updated    Co-evaluation             End of Session Equipment Utilized During Treatment: Gait belt Activity Tolerance: Patient tolerated treatment well Patient left: in  bed;with call bell/phone within reach;with bed alarm set     Time: 9604-5409 PT Time Calculation (min): 25 min  Charges:  $Gait Training: 8-22 mins $Therapeutic Exercise: 8-22 mins                    G Codes:      Delorse Lek Sep 13, 2013, 10:05 AM Delaney Meigs, PT [phone]

## 2013-08-30 ENCOUNTER — Inpatient Hospital Stay (HOSPITAL_COMMUNITY): Payer: Medicaid Other | Admitting: Anesthesiology

## 2013-08-30 ENCOUNTER — Encounter (HOSPITAL_COMMUNITY)
Admission: EM | Disposition: A | Discharge status: Home Health | Payer: Self-pay | Source: Home / Self Care | Attending: Neurosurgery

## 2013-08-30 ENCOUNTER — Inpatient Hospital Stay (HOSPITAL_COMMUNITY): Payer: Medicaid Other

## 2013-08-30 ENCOUNTER — Encounter (HOSPITAL_COMMUNITY): Payer: Medicaid Other | Admitting: Anesthesiology

## 2013-08-30 HISTORY — PX: ANTERIOR CERVICAL DECOMP/DISCECTOMY FUSION: SHX1161

## 2013-08-30 LAB — SURGICAL PCR SCREEN
MRSA, PCR: NEGATIVE
STAPHYLOCOCCUS AUREUS: NEGATIVE

## 2013-08-30 SURGERY — ANTERIOR CERVICAL DECOMPRESSION/DISCECTOMY FUSION 2 LEVELS
Anesthesia: General | Site: Neck

## 2013-08-30 MED ORDER — SCOPOLAMINE 1 MG/3DAYS TD PT72
MEDICATED_PATCH | TRANSDERMAL | Status: AC
  Filled 2013-08-30: qty 1

## 2013-08-30 MED ORDER — ONDANSETRON HCL 4 MG/2ML IJ SOLN
INTRAMUSCULAR | Status: AC
Start: 2013-08-30 — End: 2013-08-30
  Filled 2013-08-30: qty 2

## 2013-08-30 MED ORDER — NEOSTIGMINE METHYLSULFATE 10 MG/10ML IV SOLN
INTRAVENOUS | Status: DC | PRN
  Administered 2013-08-30: 3 mg via INTRAVENOUS

## 2013-08-30 MED ORDER — PHENYLEPHRINE HCL 10 MG/ML IJ SOLN
10.0000 mg | INTRAVENOUS | Status: DC | PRN
  Administered 2013-08-30: 20 ug/min via INTRAVENOUS

## 2013-08-30 MED ORDER — FENTANYL CITRATE 0.05 MG/ML IJ SOLN
INTRAMUSCULAR | Status: AC
  Filled 2013-08-30: qty 5

## 2013-08-30 MED ORDER — SODIUM CHLORIDE 0.9 % IR SOLN
Status: DC | PRN
  Administered 2013-08-30: 14:00:00

## 2013-08-30 MED ORDER — OXYCODONE HCL 5 MG PO TABS
ORAL_TABLET | ORAL | Status: AC
  Filled 2013-08-30: qty 1

## 2013-08-30 MED ORDER — 0.9 % SODIUM CHLORIDE (POUR BTL) OPTIME
TOPICAL | Status: DC | PRN
  Administered 2013-08-30: 1000 mL

## 2013-08-30 MED ORDER — OXYCODONE HCL 5 MG/5ML PO SOLN: 5.0000 mg | Freq: Once | ORAL | Status: AC | PRN

## 2013-08-30 MED ORDER — PROPOFOL 10 MG/ML IV BOLUS
INTRAVENOUS | Status: DC | PRN
  Administered 2013-08-30: 200 mg via INTRAVENOUS

## 2013-08-30 MED ORDER — MIDAZOLAM HCL 5 MG/5ML IJ SOLN
INTRAMUSCULAR | Status: DC | PRN
  Administered 2013-08-30: 2 mg via INTRAVENOUS

## 2013-08-30 MED ORDER — LIDOCAINE HCL (CARDIAC) 20 MG/ML IV SOLN
INTRAVENOUS | Status: DC | PRN
  Administered 2013-08-30: 40 mg via INTRAVENOUS

## 2013-08-30 MED ORDER — LIDOCAINE-EPINEPHRINE 1 %-1:100000 IJ SOLN
INTRAMUSCULAR | Status: DC | PRN
  Administered 2013-08-30: 6 mL

## 2013-08-30 MED ORDER — LIDOCAINE HCL 4 % MT SOLN
OROMUCOSAL | Status: DC | PRN
  Administered 2013-08-30: 2 mL via TOPICAL

## 2013-08-30 MED ORDER — GLYCOPYRROLATE 0.2 MG/ML IJ SOLN
INTRAMUSCULAR | Status: DC | PRN
  Administered 2013-08-30: .4 mg via INTRAVENOUS

## 2013-08-30 MED ORDER — PHENYLEPHRINE HCL 10 MG/ML IJ SOLN
INTRAMUSCULAR | Status: DC | PRN
  Administered 2013-08-30 (×5): 80 ug via INTRAVENOUS

## 2013-08-30 MED ORDER — OXYCODONE HCL 5 MG PO TABS
5.0000 mg | ORAL_TABLET | Freq: Once | ORAL | Status: AC | PRN
  Administered 2013-08-30: 5 mg via ORAL

## 2013-08-30 MED ORDER — CEFAZOLIN SODIUM-DEXTROSE 2-3 GM-% IV SOLR
INTRAVENOUS | Status: DC | PRN
  Administered 2013-08-30: 2 g via INTRAVENOUS

## 2013-08-30 MED ORDER — PROMETHAZINE HCL 25 MG/ML IJ SOLN: 6.2500 mg | INTRAMUSCULAR | Status: DC | PRN

## 2013-08-30 MED ORDER — ROCURONIUM BROMIDE 100 MG/10ML IV SOLN
INTRAVENOUS | Status: DC | PRN
  Administered 2013-08-30: 50 mg via INTRAVENOUS

## 2013-08-30 MED ORDER — LACTATED RINGERS IV SOLN
INTRAVENOUS | Status: DC | PRN
  Administered 2013-08-30 (×2): via INTRAVENOUS

## 2013-08-30 MED ORDER — ONDANSETRON HCL 4 MG/2ML IJ SOLN
INTRAMUSCULAR | Status: DC | PRN
  Administered 2013-08-30: 4 mg via INTRAVENOUS

## 2013-08-30 MED ORDER — MORPHINE SULFATE 2 MG/ML IJ SOLN
2.0000 mg | INTRAMUSCULAR | Status: DC | PRN
  Administered 2013-08-31 – 2013-09-02 (×8): 2 mg via INTRAVENOUS
  Filled 2013-08-30 (×9): qty 1

## 2013-08-30 MED ORDER — HYDROMORPHONE HCL PF 1 MG/ML IJ SOLN: 0.2500 mg | INTRAMUSCULAR | Status: DC | PRN

## 2013-08-30 MED ORDER — THROMBIN 5000 UNITS EX SOLR
OROMUCOSAL | Status: DC | PRN
  Administered 2013-08-30: 14:00:00 via TOPICAL

## 2013-08-30 MED ORDER — THROMBIN 20000 UNITS EX SOLR
CUTANEOUS | Status: DC | PRN
  Administered 2013-08-30: 14:00:00 via TOPICAL

## 2013-08-30 MED ORDER — LIDOCAINE HCL (CARDIAC) 20 MG/ML IV SOLN
INTRAVENOUS | Status: AC
  Filled 2013-08-30: qty 5

## 2013-08-30 MED ORDER — PROPOFOL 10 MG/ML IV BOLUS
INTRAVENOUS | Status: AC
  Filled 2013-08-30: qty 20

## 2013-08-30 MED ORDER — DEXAMETHASONE SODIUM PHOSPHATE 4 MG/ML IJ SOLN
INTRAMUSCULAR | Status: DC | PRN
  Administered 2013-08-30: 10 mg via INTRAVENOUS

## 2013-08-30 MED ORDER — DIPHENHYDRAMINE HCL 50 MG/ML IJ SOLN
INTRAMUSCULAR | Status: AC
  Filled 2013-08-30: qty 1

## 2013-08-30 MED ORDER — MIDAZOLAM HCL 2 MG/2ML IJ SOLN
INTRAMUSCULAR | Status: AC
  Filled 2013-08-30: qty 2

## 2013-08-30 MED ORDER — FENTANYL CITRATE 0.05 MG/ML IJ SOLN
INTRAMUSCULAR | Status: DC | PRN
  Administered 2013-08-30: 100 ug via INTRAVENOUS
  Administered 2013-08-30: 50 ug via INTRAVENOUS
  Administered 2013-08-30 (×3): 25 ug via INTRAVENOUS
  Administered 2013-08-30: 50 ug via INTRAVENOUS

## 2013-08-30 MED ORDER — ROCURONIUM BROMIDE 50 MG/5ML IV SOLN
INTRAVENOUS | Status: AC
  Filled 2013-08-30: qty 1

## 2013-08-30 SURGICAL SUPPLY — 82 items
ADH SKN CLS APL DERMABOND .7 (GAUZE/BANDAGES/DRESSINGS) ×1
ADH SKN CLS LQ APL DERMABOND (GAUZE/BANDAGES/DRESSINGS) ×1
APL SKNCLS STERI-STRIP NONHPOA (GAUZE/BANDAGES/DRESSINGS)
APL SRG 60D 8 XTD TIP BNDBL (TIP) ×1
BAG DECANTER FOR FLEXI CONT (MISCELLANEOUS) ×3 IMPLANT
BENZOIN TINCTURE PRP APPL 2/3 (GAUZE/BANDAGES/DRESSINGS) IMPLANT
BLADE SURG 11 STRL SS (BLADE) ×3 IMPLANT
BLADE SURG ROTATE 9660 (MISCELLANEOUS) IMPLANT
BNDG GAUZE ELAST 4 BULKY (GAUZE/BANDAGES/DRESSINGS) IMPLANT
BUR MATCHSTICK NEURO 3.0 LAGG (BURR) ×3 IMPLANT
CAGE PEEK VISTA-S 11X14X5MM (Cage) ×2 IMPLANT
CANISTER SUCT 3000ML (MISCELLANEOUS) ×3 IMPLANT
CLOSURE WOUND 1/2 X4 (GAUZE/BANDAGES/DRESSINGS)
CONT SPEC 4OZ CLIKSEAL STRL BL (MISCELLANEOUS) ×3 IMPLANT
DECANTER SPIKE VIAL GLASS SM (MISCELLANEOUS) ×3 IMPLANT
DERMABOND ADHESIVE PROPEN (GAUZE/BANDAGES/DRESSINGS) ×2
DERMABOND ADVANCED (GAUZE/BANDAGES/DRESSINGS) ×2
DERMABOND ADVANCED .7 DNX12 (GAUZE/BANDAGES/DRESSINGS) IMPLANT
DERMABOND ADVANCED .7 DNX6 (GAUZE/BANDAGES/DRESSINGS) ×1 IMPLANT
DEVICE FUSION VIST S 14X14X5MM (Cage) ×1 IMPLANT
DRAIN CHANNEL 10M FLAT 3/4 FLT (DRAIN) IMPLANT
DRAPE C-ARM 42X72 X-RAY (DRAPES) ×6 IMPLANT
DRAPE LAPAROTOMY 100X72 PEDS (DRAPES) ×3 IMPLANT
DRAPE MICROSCOPE LEICA (MISCELLANEOUS) ×3 IMPLANT
DRAPE POUCH INSTRU U-SHP 10X18 (DRAPES) ×3 IMPLANT
DRAPE PROXIMA HALF (DRAPES) IMPLANT
DRSG OPSITE POSTOP 3X4 (GAUZE/BANDAGES/DRESSINGS) ×3 IMPLANT
DRSG TEGADERM 4X4.75 (GAUZE/BANDAGES/DRESSINGS) ×12 IMPLANT
DURAPREP 6ML APPLICATOR 50/CS (WOUND CARE) ×3 IMPLANT
DURASEAL APPLICATOR TIP (TIP) ×2 IMPLANT
DURASEAL SPINE SEALANT 3ML (MISCELLANEOUS) ×2 IMPLANT
ELECT COATED BLADE 2.86 ST (ELECTRODE) ×3 IMPLANT
ELECT REM PT RETURN 9FT ADLT (ELECTROSURGICAL) ×3
ELECTRODE REM PT RTRN 9FT ADLT (ELECTROSURGICAL) ×1 IMPLANT
EVACUATOR SILICONE 100CC (DRAIN) IMPLANT
GAUZE SPONGE 4X4 16PLY XRAY LF (GAUZE/BANDAGES/DRESSINGS) IMPLANT
GLOVE BIO SURGEON STRL SZ 6.5 (GLOVE) ×6 IMPLANT
GLOVE BIO SURGEONS STRL SZ 6.5 (GLOVE) ×3
GLOVE BIOGEL PI IND STRL 7.0 (GLOVE) IMPLANT
GLOVE BIOGEL PI IND STRL 7.5 (GLOVE) ×1 IMPLANT
GLOVE BIOGEL PI INDICATOR 7.0 (GLOVE) ×4
GLOVE BIOGEL PI INDICATOR 7.5 (GLOVE) ×2
GLOVE ECLIPSE 6.5 STRL STRAW (GLOVE) ×4 IMPLANT
GLOVE ECLIPSE 7.0 STRL STRAW (GLOVE) ×3 IMPLANT
GLOVE EXAM NITRILE LRG STRL (GLOVE) ×2 IMPLANT
GLOVE EXAM NITRILE MD LF STRL (GLOVE) IMPLANT
GLOVE EXAM NITRILE XL STR (GLOVE) IMPLANT
GLOVE EXAM NITRILE XS STR PU (GLOVE) IMPLANT
GLOVE SURG SS PI 7.0 STRL IVOR (GLOVE) ×4 IMPLANT
GOWN STRL REUS W/ TWL LRG LVL3 (GOWN DISPOSABLE) ×2 IMPLANT
GOWN STRL REUS W/ TWL XL LVL3 (GOWN DISPOSABLE) IMPLANT
GOWN STRL REUS W/TWL 2XL LVL3 (GOWN DISPOSABLE) IMPLANT
GOWN STRL REUS W/TWL LRG LVL3 (GOWN DISPOSABLE) ×9
GOWN STRL REUS W/TWL XL LVL3 (GOWN DISPOSABLE) ×3
GRAFT DURAGEN MATRIX 1WX1L (Tissue) ×2 IMPLANT
HEMOSTAT POWDER KIT SURGIFOAM (HEMOSTASIS) ×3 IMPLANT
KIT BASIN OR (CUSTOM PROCEDURE TRAY) ×3 IMPLANT
KIT ROOM TURNOVER OR (KITS) ×3 IMPLANT
NDL HYPO 25X1 1.5 SAFETY (NEEDLE) ×1 IMPLANT
NEEDLE HYPO 25X1 1.5 SAFETY (NEEDLE) ×3 IMPLANT
NEEDLE SPNL 22GX3.5 QUINCKE BK (NEEDLE) ×3 IMPLANT
NS IRRIG 1000ML POUR BTL (IV SOLUTION) ×3 IMPLANT
PACK LAMINECTOMY NEURO (CUSTOM PROCEDURE TRAY) ×3 IMPLANT
PAD ARMBOARD 7.5X6 YLW CONV (MISCELLANEOUS) ×9 IMPLANT
PIN DISTRACTION 12MM (PIN) ×6 IMPLANT
PLATE INVIZIA 2 LEV 38MM (Plate) ×2 IMPLANT
PUTTY BONE GRAFT KIT 2.5ML (Bone Implant) ×3 IMPLANT
RUBBERBAND STERILE (MISCELLANEOUS) ×6 IMPLANT
SCREW SELF DRILL VAR 14MMX4.2 (Screw) ×18 IMPLANT
SPONGE INTESTINAL PEANUT (DISPOSABLE) ×3 IMPLANT
SPONGE SURGIFOAM ABS GEL 100 (HEMOSTASIS) ×3 IMPLANT
STRIP CLOSURE SKIN 1/2X4 (GAUZE/BANDAGES/DRESSINGS) IMPLANT
SUT ETHILON 3 0 FSL (SUTURE) IMPLANT
SUT VIC AB 3-0 SH 8-18 (SUTURE) ×3 IMPLANT
SUT VICRYL 3-0 RB1 18 ABS (SUTURE) ×5 IMPLANT
SYR 20ML ECCENTRIC (SYRINGE) ×3 IMPLANT
TAPE CLOTH 3X10 TAN LF (GAUZE/BANDAGES/DRESSINGS) ×3 IMPLANT
TOWEL OR 17X24 6PK STRL BLUE (TOWEL DISPOSABLE) ×3 IMPLANT
TOWEL OR 17X26 10 PK STRL BLUE (TOWEL DISPOSABLE) ×3 IMPLANT
TRAP SPECIMEN MUCOUS 40CC (MISCELLANEOUS) ×3 IMPLANT
VISTA S 14X14X5MM (Cage) ×3 IMPLANT
WATER STERILE IRR 1000ML POUR (IV SOLUTION) ×3 IMPLANT

## 2013-08-30 NOTE — Transfer of Care (Signed)
Immediate Anesthesia Transfer of Care Note  Patient: [name]  Procedure(s) Performed: Procedure(s) with comments: ANTERIOR CERVICAL DECOMPRESSION/DISCECTOMY FUSION 2 LEVELS (N/A) - C34 C45 anterior cervical decompression with fusion interbody prosthesis plating and bonegraft  Patient Location: PACU  Anesthesia Type:General  Level of Consciousness: awake, alert  and oriented  Airway & Oxygen Therapy: Patient Spontanous Breathing  Post-op Assessment: Report given to PACU RN  Post vital signs: Reviewed and stable  Complications: No apparent anesthesia complications

## 2013-08-30 NOTE — Progress Notes (Signed)
UR Completed Rilyn Upshaw Graves-Bigelow, RN,BSN [phone]  

## 2013-08-30 NOTE — Progress Notes (Signed)
Physical Therapy Treatment Patient Details Name: Albert Bond MRN: [id number] DOB: 1950/10/23 Today's Date: 08/30/2013    History of Present Illness 63 yo male with c/o syncope, pt not certain how long he was out for.  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  Cervical canal stenosis.  PMHx:  back pain, alcohol abuse, remote lacunar infarct, Hx of ETOH use. On CIWA protocol.. ACDF 08/30/13.    PT Comments    Pt continues to progress with mobility with cues needed for safety and assist for balance. Pt able to walk increased distance without RW today and encouraged continued HEP. Sx was pushed from yesterday to today and will continue to assess post-op.  Follow Up Recommendations  Home health PT;Supervision for mobility/OOB     Equipment Recommendations       Recommendations for Other Services       Precautions / Restrictions Precautions Precautions: Fall Precaution Comments: encouraged  cervical precautions    Mobility  Bed Mobility               General bed mobility comments: in chair on arrival  Transfers     Transfers: Sit to/from Stand Sit to Stand: Supervision         General transfer comment: supervision for safety with cues for hand placement  Ambulation/Gait Ambulation/Gait assistance: Min guard Ambulation Distance (Feet): 450 Feet Assistive device: Rolling walker (2 wheeled);None     Gait velocity interpretation: Below normal speed for age/gender General Gait Details: Pt able to perform increased gait distance with reliance on RW today with maintained narrow BOS with heel to toe walking pattern. Without RW grossly 150' pt with slow gait, scissoring x 3 and noted continued difficulty advancing LLE and maintaing balance   Stairs            Wheelchair Mobility    Modified Rankin (Stroke Patients Only)       Balance Overall balance assessment: Needs assistance   Sitting  balance-Leahy Scale: Good       Standing balance-Leahy Scale: Fair                      Cognition Arousal/Alertness: Awake/alert Behavior During Therapy: WFL for tasks assessed/performed   Area of Impairment: Safety/judgement         Safety/Judgement: Decreased awareness of safety;Decreased awareness of deficits          Exercises General Exercises - Lower Extremity Long Arc Quad: AROM;Seated;Both;20 reps Hip ABduction/ADduction: AROM;Both;20 reps;Seated Hip Flexion/Marching: AROM;Seated;Both;20 reps Toe Raises: AROM;Seated;Both;20 reps Heel Raises: AROM;Seated;Both;20 reps    General Comments        Pertinent Vitals/Pain Pain Assessment: No/denies pain    Home Living                      Prior Function            PT Goals (current goals can now be found in the care plan section) Progress towards PT goals: Progressing toward goals    Frequency       PT Plan Current plan remains appropriate    Co-evaluation             End of Session   Activity Tolerance: Patient tolerated treatment well Patient left: in chair;with call bell/phone within reach;with chair alarm set     Time: 4098-1191 PT Time Calculation (min): 23 min  Charges:  $Gait Training: 8-22 mins $Therapeutic  Exercise: 8-22 mins                    G Codes:      Delorse Lek Sep 12, 2013, 1:06 PM Delaney Meigs, PT [phone]

## 2013-08-30 NOTE — Anesthesia Preprocedure Evaluation (Addendum)
Anesthesia Evaluation  Patient identified by MRN, date of birth, ID band Patient awake    Reviewed: Allergy & Precautions, H&P , NPO status , Patient's Chart, lab work & pertinent test results  History of Anesthesia Complications Negative for: history of anesthetic complications  Airway Mallampati: I TM Distance: >3 FB Neck ROM: Full    Dental  (+) Poor Dentition, Missing, Chipped, Loose, Dental Advisory Given   Pulmonary Current Smoker,    Pulmonary exam normal       Cardiovascular negative cardio ROS      Neuro/Psych negative neurological ROS  negative psych ROS   GI/Hepatic negative GI ROS, (+)     substance abuse  alcohol use,   Endo/Other  negative endocrine ROS  Renal/GU negative Renal ROS     Musculoskeletal   Abdominal   Peds  Hematology  (+) anemia ,   Anesthesia Other Findings   Reproductive/Obstetrics                         Anesthesia Physical Anesthesia Plan  ASA: III  Anesthesia Plan: General   Post-op Pain Management:    Induction: Intravenous  Airway Management Planned: Oral ETT  Additional Equipment:   Intra-op Plan:   Post-operative Plan: Extubation in OR  Informed Consent: I have reviewed the patients History and Physical, chart, labs and discussed the procedure including the risks, benefits and alternatives for the proposed anesthesia with the patient or authorized representative who has indicated his/her understanding and acceptance.   Dental advisory given  Plan Discussed with: CRNA, Anesthesiologist and Surgeon  Anesthesia Plan Comments:       Anesthesia Quick Evaluation

## 2013-08-30 NOTE — Op Note (Signed)
PREOP DIAGNOSIS:  1. 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 Discectomy at C3-4, C4-5 for decompression of spinal cord and exiting nerve roots  2. Placement of intervertebral biomechanical device:  5mm parallel Zimmer PEEK cage at C3-4  5mm parallel Zimmer PEEK cage at C4-5 3. Placement of anterior instrumentation consisting of interbody plate (38mm) and screws spanning C3-C5 4. Use of morselized bone allograft  5. Arthrodesis C3-4, C4-5, anterior interbody technique  6. Use of intraoperative microscope  SURGEON: Dr. Lisbeth Renshaw, MD  ASSISTANT: Dr. Herbie Saxon, MD  ANESTHESIA: General Endotracheal  EBL: 100cc  SPECIMENS: None  DRAINS: None  COMPLICATIONS: None immediate  CONDITION: Hemodynamically stable to PACU  HISTORY: Albert Bond is a 63 y.o. man admitted through the emergency department with a primary complaint of left-sided weakness. He was worked up for stroke, and seen by neurology, however MRI of the cervical spine was also ordered. 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 With these findings and his exam demonstrating weakness in all 4 extremities, surgical decompression was indicated. The risks and benefits of the surgery were explained in detail to the patient. After all his questions were answered, consent was obtained.  PROCEDURE IN DETAIL: The patient was brought to the operating room and transferred to the operative table. After induction of general anesthesia, the patient was positioned on the operative table in the supine position with all pressure points meticulously padded. The skin of the neck was then prepped and draped in the usual sterile fashion.  After timeout was conducted, the skin was infiltrated with local anesthetic. Skin incision was  then made sharply and Bovie electrocautery was used to dissect the subcutaneous tissue until the platysma was identified. The platysma was then divided and undermined. The sternocleidomastoid muscle was then identified and, utilizing natural fascial planes in the neck, the prevertebral fascia was identified and the carotid sheath was retracted laterally and the trachea and esophagus retracted medially. Again using fluoroscopy, the correct disc spaces were identified. Bovie electrocautery was used to dissect in the subperiosteal plane and elevate the bilateral longus coli muscles. Self-retaining retractors were then placed. At this point, the microscope was draped and brought into the field, and the remainder of the case was done under the microscope using microdissecting technique.  The C3-4 disc space was incised sharply and rongeurs were use to initially complete a discectomy. The high-speed drill was then used to complete discectomy until the posterior annulus was identified and removed and the posterior longitudinal ligament was identified. Using a nerve hook, the PLL was elevated, and Kerrison rongeurs were used to remove the posterior longitudinal ligament and the ventral thecal sac was identified. Using a combination of curettes and rongeurs, complete decompression of the thecal sac and exiting nerve roots at this level was completed, and verified using micro-nerve hook. He did note a large amount of hypertrophied ligament which was primarily responsible for the spinal cord compression. In dissecting the ligament away from the dura, a small durotomy was created to the right of midline measuring approximately 1-2 mm. This was covered with DuraSeal.  At this point, a 5 mm parallel interbody cage was sized and packed with morcellized bone allograft. This was then inserted and tapped into place.  Attention was then turned to the C4-5 level. In a similar fashion, discectomy was completed  initially with  curettes and rongeurs, and completed with the drill. The PLL was again identified, elevated and incised. Using Kerrison rongeurs, decompression of the spinal cord and exiting roots was completed and confirmed with a dissector. Similar to the level above, the spinal cord compression appeared due primarily to significantly hypertrophied ligament.  A 5 mm parallel interbody cage was then sized and filled with bone allograft, and tapped into place. Position of the interbody devices was then confirmed with fluoroscopy.  After placement of the intervertebral devices, the anterior cervical plate was selected, and placed across the interspaces. Using a high-speed drill, the cortex of the cervical vertebral bodies was punctured, and screws inserted in the C3, C4, and C5 levels. Final fluoroscopic images in AP and lateral projections were taken to confirm good hardware placement.  At this point, after all counts were verified to be correct, meticulous hemostasis was secured using a combination of bipolar electrocautery and passive hemostatics. The platysma muscle was then closed using interrupted 3-0 Vicryl sutures, and the skin was closed with a running subcuticular stitch. Sterile dressings were then applied and the drapes removed.  The patient tolerated the procedure well and was extubated in the room and taken to the postanesthesia care unit in stable condition.

## 2013-08-30 NOTE — Progress Notes (Signed)
No issues overnight. Pt has no complaints. Ready for surgery today.  EXAM:  BP 107/74  Pulse 70  Temp(Src) 97.6 F (36.4 C) (Oral)  Resp 18  Ht  (1.702 m)  Wt 68.539 kg (151 lb 1.6 oz)  BMI 23.66 kg/m2  SpO2 100%  Awake, alert, oriented  Speech fluent, appropriate  CN grossly intact  Stable mild distal UE weakness and proximal L>R LE weakness  IMPRESSION:  63 y.o. male with cervical spondylotic myelopathy  PLAN: - C3-4, C4-5 ACDF today - Periop abx - Will give decadron  in OR

## 2013-08-31 ENCOUNTER — Encounter (HOSPITAL_COMMUNITY): Payer: Self-pay | Admitting: Neurosurgery

## 2013-08-31 NOTE — Progress Notes (Signed)
No issues overnight. Pt reports neck soreness, and some difficulty eating regular diet. Was able to take thin liquids. Reports subjective improvement in LLE and BUE strength, but slightly decreased proximal RLE strength  EXAM:  BP 124/73  Pulse 91  Temp(Src) 99 F (37.2 C) (Oral)  Resp 18  Ht  (1.702 m)  Wt 63.957 kg (141 lb)  BMI 22.08 kg/m2  SpO2 100%  Awake, alert, oriented  Speech fluent, appropriate  CN grossly intact  Stable 4/5 proximal BLE Decreased grip strength BUE, decreased tricep strength L>R Wound dressing c/d/i, no leak  IMPRESSION:  63 y.o. male POD#1 s/p C3-4, C4-5 ACDF neurologically at baseline  PLAN: - Soft diet - Cont PT/OT. - Will plan on d/c likely tomorrow if stable with [facility]

## 2013-08-31 NOTE — Progress Notes (Signed)
Occupational Therapy Evaluation Patient Details Name: Albert Bond MRN: [id number] DOB: [birth year] Today's Date: 08/31/2013    History of Present Illness 63 yo male with c/o syncope, pt not certain how long he was out for.  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  Cervical canal stenosis.  PMHx:  back pain, alcohol abuse, remote lacunar infarct, Hx of ETOH use. On CIWA protocol.. ACDF 08/30/2013   Clinical Impression   Pt is now s/p ACDF on 08/30/13 following c/o L side weakness. Pt reports improving symptoms in LUE and reports normal sensation throughout LUE. Pt participated in therapeutic exercise for LUE strengthening and currently requires min guard for transfers and ADLs. Educated pt on cervical precautions and encouraged him to follow during ADLs. Pt would benefit from continued OT to address cervical precautions and ADLs. CIR screened pt this date and has recommended HH at this time.     Follow Up Recommendations  Home health OT;Supervision/Assistance - 24 hour    Equipment Recommendations   N/A   Recommendations for Other Services       Precautions / Restrictions Precautions Precautions: Fall Precaution Comments: encouraged  cervical precautions Restrictions Weight Bearing Restrictions: No      Mobility Bed Mobility Overal bed mobility: Needs Assistance Bed Mobility: Rolling;Sit to Sidelying Rolling: Supervision Sidelying to sit: Supervision     Sit to sidelying: Supervision General bed mobility comments: v/c's for technique  Transfers Overall transfer level: Needs assistance Equipment used: Rolling walker (2 wheeled) Transfers: Sit to/from [employer] Sit to Stand: Min guard Stand pivot transfers: Min guard       General transfer comment: pt requiring increased time, unsteady upon initial standing    Balance Overall balance assessment: Needs assistance   Sitting  balance-Leahy Scale: Good     Standing balance support: No upper extremity supported Standing balance-Leahy Scale: Poor Standing balance comment: unsteady requiring minA to maintain balance                            ADL Overall ADL's : Needs assistance/impaired Eating/Feeding: Set up;Sitting Eating/Feeding Details (indicate cue type and reason): Continues to have difficulty holding utensils, opening packages and cutting food.  Grooming: Wash/dry hands;Min guard;Standing   Upper Body Bathing: Set up;Sitting   Lower Body Bathing: Min guard;Sit to/from stand   Upper Body Dressing : Set up;Sitting   Lower Body Dressing: Min guard;Sit to/from stand   Toilet Transfer: Min guard;Ambulation;RW   Toileting- Architect and Hygiene: Min guard;Sit to/from stand       Functional mobility during ADLs: Min guard;Rolling walker General ADL Comments: Pt requested to return to bed with OT and performed therapeutic exercises sitting EOB. Encouraged pt to adhere to cervical precautions but required consistent reminders.      Vision                     Perception Perception Perception Tested?: No   Praxis Praxis Praxis tested?: Within functional limits    Pertinent Vitals/Pain Pain Assessment: 0-10 Pain Score: 7  Pain Location: neck Pain Descriptors / Indicators: Aching Pain Intervention(s): Limited activity within patient's tolerance;Monitored during session;Repositioned     Hand Dominance Right   Extremity/Trunk Assessment Upper Extremity Assessment Upper Extremity Assessment: LUE deficits/detail LUE Deficits / Details: grossly 3+/5, limited L hand fine motor, unable to open hand up all the way, no fine dexterity. reports sensation to  be normal LUE Coordination: decreased fine motor;decreased gross motor   Lower Extremity Assessment Lower Extremity Assessment: Defer to PT evaluation LLE Deficits / Details: scissored gait with RW, impaired  co-ordination   Cervical / Trunk Assessment Cervical / Trunk Assessment: Kyphotic   Communication Communication Communication: No difficulties   Cognition Arousal/Alertness: Awake/alert Behavior During Therapy: WFL for tasks assessed/performed Overall Cognitive Status: No family/caregiver present to determine baseline cognitive functioning Area of Impairment: Safety/judgement     Memory: Decreased recall of precautions   Safety/Judgement: Decreased awareness of safety            Exercises Exercises: Other exercises Other Exercises Other Exercises: AAROM using prayer stretch for composite digit extension of LUE.  Other Exercises: A/AROM of elbow, wrist, and hand with therapist applied resistence for strengthening of LUE.         Home Living Family/patient expects to be discharged to:: Private residence Living Arrangements: Other relatives Available Help at Discharge: Family;Available PRN/intermittently Type of Home: House Home Access: Ramped entrance Entrance Stairs-Number of Steps: 3 Entrance Stairs-Rails: Can reach both Home Layout: One level     Bathroom Shower/Tub: Producer, television/film/video: Standard     Home Equipment: Production assistant, radio - single point   Additional Comments: Pt has multiple family members to assist him      Prior Functioning/Environment Level of Independence: Independent with assistive device(s)        Comments: recent weakness caused more difficulty with ADL. Fall day of admission.    OT Diagnosis: Generalized weakness;Cognitive deficits;Paresis   OT Problem List: Decreased strength;Decreased range of motion;Decreased activity tolerance;Impaired balance (sitting and/or standing);Decreased coordination;Decreased safety awareness;Decreased knowledge of use of DME or AE;Impaired sensation;Impaired tone;Impaired UE functional use   OT Treatment/Interventions: Self-care/ADL training;Therapeutic exercise;Neuromuscular education;DME  and/or AE instruction;Therapeutic activities;Cognitive remediation/compensation;Patient/family education;Balance training    OT Goals(Current goals can be found in the care plan section) Acute Rehab OT Goals Patient Stated Goal: go home OT Goal Formulation: With patient Time For Goal Achievement: 09/08/13 Potential to Achieve Goals: Good  OT Frequency: Min 3X/week    End of Session Equipment Utilized During Treatment: Gait belt;Rolling walker  Activity Tolerance: Patient tolerated treatment well Patient left: in bed;with call bell/phone within reach   Time: 1053-1110 OT Time Calculation (min): 17 min Charges:  OT General Charges $OT Visit: 1 Procedure OT Evaluation $Initial OT Evaluation Tier I: 1 Procedure OT Treatments $Therapeutic Exercise: 8-22 mins  Rae Lips [phone] 08/31/2013, 11:47 AM

## 2013-08-31 NOTE — Progress Notes (Signed)
Rehab Admissions Coordinator Note:  Patient was screened by Clois Dupes for appropriateness for an Inpatient Acute Rehab Consult per PT recommendation and discussion.  At this time, we are recommending HH. Feel pt will progress with therapy to be able to d/c home. Please call me with any questions.   Clois Dupes 08/31/2013, 10:35 AM  I can be reached at [phone].

## 2013-08-31 NOTE — Progress Notes (Signed)
Patient transferred to 4N05, alert and oriented. Patient oriented to room and equipment. Initial assessment completed. NAD noted. Will continue to monitor.

## 2013-08-31 NOTE — Evaluation (Signed)
Physical Therapy Evaluation Patient Details Name: Albert Bond MRN: [id number] DOB: 23-Jun-1950 Today's Date: 08/31/2013   History of Present Illness  63 yo male with c/o syncope, pt not certain how long he was out for.  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  Cervical canal stenosis.  PMHx:  back pain, alcohol abuse, remote lacunar infarct, Hx of ETOH use. On CIWA protocol.. ACDF 08/30/2013  Clinical Impression  Pt s/p above surgery con't to presenting with L UE>LE weakness, significant impaired balance, impaired L UE fine motor and scissored gait pattern. Pt to strongly benefit from CIR to achieve maximal functional recovery for safe transition home. However pt strongly desires to return home and would need HHPT or outpt PT services if pt has ride to address above symptoms.    Follow Up Recommendations CIR    Equipment Recommendations  Rolling walker with 5" wheels    Recommendations for Other Services Rehab consult     Precautions / Restrictions Precautions Precautions: Fall Precaution Comments: encouraged  cervical precautions Restrictions Weight Bearing Restrictions: No      Mobility  Bed Mobility Overal bed mobility: Needs Assistance Bed Mobility: Rolling;Sit to Sidelying Rolling: Supervision Sidelying to sit: Supervision       General bed mobility comments: v/c's for technique  Transfers Overall transfer level: Needs assistance Equipment used: Rolling walker (2 wheeled) Transfers: Sit to/from Stand Sit to Stand: Min guard         General transfer comment: pt requiring increased time, unsteady upon initial standing  Ambulation/Gait Ambulation/Gait assistance: Min assist Ambulation Distance (Feet): 300 Feet Assistive device: Rolling walker (2 wheeled);None Gait Pattern/deviations: Scissoring;Narrow base of support Gait velocity: reduced Gait velocity interpretation: Below normal speed for  age/gender General Gait Details: attempted to amb without RW per pt request however pt extremely unsafe and at increased falls risk. Pt with improved stride length and stability with rolling walker however did have a narrowed bos and occasional scissored gait pattern with RW  Stairs            Wheelchair Mobility    Modified Rankin (Stroke Patients Only)       Balance Overall balance assessment: Needs assistance   Sitting balance-Leahy Scale: Good     Standing balance support: No upper extremity supported Standing balance-Leahy Scale: Poor Standing balance comment: unsteady requiring minA to maintain balance                             Pertinent Vitals/Pain Pain Assessment: 0-10 Pain Score: 7  Pain Location: neck Pain Descriptors / Indicators: Aching    Home Living Family/patient expects to be discharged to:: Private residence Living Arrangements: Other relatives Available Help at Discharge: Family;Available PRN/intermittently Type of Home: House Home Access: Ramped entrance     Home Layout: One level Home Equipment: Shower seat;Cane - single point Additional Comments: Pt has multiple family members to assist him    Prior Function Level of Independence: Independent with assistive device(s)               Hand Dominance   Dominant Hand: Right    Extremity/Trunk Assessment           LUE Deficits / Details: grossly 3+/5, limited L hand fine motor, unable to open hand up all the way, no fine dexterity. reports sensation to be normal   Lower Extremity Assessment: Generalized weakness   LLE Deficits /  Details: scissored gait with RW, impaired co-ordination     Communication   Communication: No difficulties  Cognition Arousal/Alertness: Awake/alert Behavior During Therapy: WFL for tasks assessed/performed Overall Cognitive Status: No family/caregiver present to determine baseline cognitive functioning Area of Impairment:  Safety/judgement         Safety/Judgement: Decreased awareness of safety          General Comments      Exercises        Assessment/Plan    PT Assessment Patient needs continued PT services  PT Diagnosis Abnormality of gait   PT Problem List Decreased strength;Decreased range of motion;Decreased activity tolerance;Decreased balance;Decreased mobility;Decreased coordination;Decreased knowledge of use of DME;Decreased safety awareness;Decreased knowledge of precautions  PT Treatment Interventions DME instruction;Gait training;Functional mobility training;Therapeutic activities;Therapeutic exercise;Balance training;Neuromuscular re-education   PT Goals (Current goals can be found in the Care Plan section) Acute Rehab PT Goals Patient Stated Goal: go home PT Goal Formulation: With patient Time For Goal Achievement: 09/07/13 Potential to Achieve Goals: Good    Frequency Min 5X/week   Barriers to discharge        Co-evaluation               End of Session Equipment Utilized During Treatment: Gait belt Activity Tolerance: Patient tolerated treatment well Patient left: in chair;with chair alarm set;with family/visitor present;with call bell/phone within reach Nurse Communication: Mobility status;Precautions         Time: 1610-9604 PT Time Calculation (min): 27 min   Charges:   PT Evaluation $PT Re-evaluation: 1 Procedure PT Treatments $Gait Training: 8-22 mins   PT G CodesMarcene Brawn 08/31/2013, 10:02 AM  Lewis Shock, PT, DPT Pager #: [phone] Office #: [phone]

## 2013-08-31 NOTE — Progress Notes (Signed)
Paged Dr. Lovell Sheehan concerning patients increased temp without relief from PRN. New orders given. Will continue to assess per shift.

## 2013-09-01 ENCOUNTER — Inpatient Hospital Stay (HOSPITAL_COMMUNITY): Payer: Medicaid Other

## 2013-09-01 LAB — CBC WITH DIFFERENTIAL/PLATELET
Basophils Absolute: 0 10*3/uL (ref 0.0–0.1)
Basophils Relative: 0 % (ref 0–1)
EOS ABS: 0 10*3/uL (ref 0.0–0.7)
EOS PCT: 0 % (ref 0–5)
HCT: 36.7 % — ABNORMAL LOW (ref 39.0–52.0)
Hemoglobin: 12.4 g/dL — ABNORMAL LOW (ref 13.0–17.0)
LYMPHS ABS: 2.6 10*3/uL (ref 0.7–4.0)
Lymphocytes Relative: 29 % (ref 12–46)
MCH: 30 pg (ref 26.0–34.0)
MCHC: 33.8 g/dL (ref 30.0–36.0)
MCV: 88.9 fL (ref 78.0–100.0)
Monocytes Absolute: 1.3 10*3/uL — ABNORMAL HIGH (ref 0.1–1.0)
Monocytes Relative: 14 % — ABNORMAL HIGH (ref 3–12)
NEUTROS PCT: 57 % (ref 43–77)
Neutro Abs: 5.1 10*3/uL (ref 1.7–7.7)
PLATELETS: 121 10*3/uL — AB (ref 150–400)
RBC: 4.13 MIL/uL — AB (ref 4.22–5.81)
RDW: 14.6 % (ref 11.5–15.5)
WBC: 9 10*3/uL (ref 4.0–10.5)

## 2013-09-01 LAB — URINALYSIS, ROUTINE W REFLEX MICROSCOPIC
BILIRUBIN URINE: NEGATIVE
Glucose, UA: NEGATIVE mg/dL
Hgb urine dipstick: NEGATIVE
Ketones, ur: NEGATIVE mg/dL
Nitrite: NEGATIVE
Protein, ur: NEGATIVE mg/dL
SPECIFIC GRAVITY, URINE: 1.027 (ref 1.005–1.030)
UROBILINOGEN UA: 0.2 mg/dL (ref 0.0–1.0)
pH: 5.5 (ref 5.0–8.0)

## 2013-09-01 LAB — URINE MICROSCOPIC-ADD ON

## 2013-09-01 MED ORDER — RESOURCE THICKENUP CLEAR PO POWD
ORAL | Status: DC | PRN
  Filled 2013-09-01: qty 125

## 2013-09-01 NOTE — Progress Notes (Signed)
Occupational Therapy Treatment Patient Details Name: Ziyan Schoon MRN: [id number] DOB: 16-Oct-1950 Today's Date: 09/01/2013    History of present illness 63 yo male with c/o syncope, pt not certain how long he was out for.  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  Cervical canal stenosis.  PMHx:  back pain, alcohol abuse, remote lacunar infarct, Hx of ETOH use. On CIWA protocol.. ACDF 08/30/2013   OT comments  Pt needs reinforcement for UE HEP and follow up. Pt directed through functional task this session to help facilitate these motions of BIL UE to help provide treatment in a more active motion. Pt with prolonged hand washing at sink due to BIL UE deficits to complete task.    Follow Up Recommendations  Home health OT;Supervision/Assistance - 24 hour    Equipment Recommendations  Other (comment)    Recommendations for Other Services      Precautions / Restrictions Precautions Precautions: Fall Precaution Comments: encouraged  cervical precautions       Mobility Bed Mobility               General bed mobility comments: in chair on arrival  Transfers Overall transfer level: Needs assistance Equipment used: Rolling walker (2 wheeled) Transfers: Sit to/from Stand Sit to Stand: Min guard         General transfer comment: requires bil UE on chair arm rest    Balance Overall balance assessment: Needs assistance         Standing balance support: No upper extremity supported;During functional activity Standing balance-Leahy Scale: Fair                     ADL Overall ADL's : Needs assistance/impaired     Grooming: Wash/dry hands;Min guard Grooming Details (indicate cue type and reason): decr fine motor skills, dysmetria with hand hygiene             Lower Body Dressing: Min guard;Sit to/from stand Lower Body Dressing Details (indicate cue type and reason): prolonged time to don  doff socks. pt reports task as more difficult than prior to admission. pt demonstrates decr overall grasp of LT hand. Pt educated on crossing bil LE to prevent neck flexion. pt needed mod v/c for safety Toilet Transfer: Min guard;Ambulation;RW   Toileting- Architect and Hygiene: Min guard;Sit to/from stand       Functional mobility during ADLs: Min guard;Rolling walker General ADL Comments: Pt with decr LT UE gross movement compared to RT UE. pt demonstrates 4th and 5th digit claw like hand in neutral. pt with deficits with extension of digits. Pt educated on safety and AAROM. pt states "oh yeah well this works better" pt needs redirection several times during session. pt reports swallowing to be better but doesnt have anythign he wants to eat      Vision                     Perception     Praxis      Cognition   Behavior During Therapy: [facility] for tasks assessed/performed Overall Cognitive Status: Impaired/Different from baseline Area of Impairment: Safety/judgement     Memory: Decreased recall of precautions    Safety/Judgement: Decreased awareness of safety     General Comments: Pt not following cervical precautions and when cued states "this way is easier'    Extremity/Trunk Assessment  Exercises     Shoulder Instructions       General Comments      Pertinent Vitals/ Pain       Pain Assessment: No/denies pain Faces Pain Scale: No hurt  Home Living                                          Prior Functioning/Environment              Frequency Min 3X/week     Progress Toward Goals  OT Goals(current goals can now be found in the care plan section)  Progress towards OT goals: Progressing toward goals  Acute Rehab OT Goals Patient Stated Goal: go home OT Goal Formulation: With patient Time For Goal Achievement: 09/08/13 Potential to Achieve Goals: Good ADL Goals Pt Will Perform Eating: with  modified independence;sitting;with adaptive utensils Pt Will Perform Grooming: with adaptive equipment;standing;with supervision Pt Will Perform Lower Body Bathing: with supervision;sit to/from stand Pt Will Perform Upper Body Dressing: with modified independence;sitting Pt Will Perform Lower Body Dressing: with supervision;sit to/from stand;with adaptive equipment Pt Will Transfer to Toilet: with supervision;bedside commode;ambulating Pt/caregiver will Perform Home Exercise Program: Both right and left upper extremity;With theraputty;With written HEP provided  Plan Discharge plan remains appropriate    Co-evaluation                 End of Session Equipment Utilized During Treatment: Gait belt;Rolling walker   Activity Tolerance Patient tolerated treatment well   Patient Left in chair;with call bell/phone within reach   Nurse Communication Mobility status;Precautions        Time: 4696-2952 OT Time Calculation (min): 21 min  Charges: OT General Charges $OT Visit: 1 Procedure OT Treatments $Self Care/Home Management : 8-22 mins  Harolyn Rutherford 09/01/2013, 4:01 PM Pager: [phone]

## 2013-09-01 NOTE — Progress Notes (Signed)
Physical Therapy Treatment Patient Details Name: Albert Bond MRN: [id number] DOB: 02-28-1950 Today's Date: 09/01/2013    History of Present Illness 63 yo male with c/o syncope, pt not certain how long he was out for.  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  Cervical canal stenosis.  PMHx:  back pain, alcohol abuse, remote lacunar infarct, Hx of ETOH use. On CIWA protocol.. ACDF 08/30/2013    PT Comments    Progressing, but could still use rehab prior to d/c home.   Follow Up Recommendations  CIR     Equipment Recommendations  Rolling walker with 5" wheels    Recommendations for Other Services Rehab consult     Precautions / Restrictions Precautions Precautions: Fall Precaution Comments: encouraged  cervical precautions    Mobility  Bed Mobility Overal bed mobility: Needs Assistance Bed Mobility: Rolling;Sidelying to Sit;Sit to Sidelying Rolling: Supervision Sidelying to sit: Min guard     Sit to sidelying: Min guard General bed mobility comments: reinforced safe bed mobility technique  Transfers Overall transfer level: Needs assistance Equipment used: Rolling walker (2 wheeled) Transfers: Sit to/from Stand Sit to Stand: Min guard         General transfer comment: cues for safe hand placement  Ambulation/Gait Ambulation/Gait assistance: Min assist;Min guard Ambulation Distance (Feet): 200 Feet Assistive device: Rolling walker (2 wheeled);None Gait Pattern/deviations: Step-through pattern Gait velocity: slow   General Gait Details: steady at times, then lists off left  when loses focus   Stairs Stairs: Yes Stairs assistance: Min assist Stair Management: One rail Right;Step to pattern;Forwards Number of Stairs: 5 General stair comments: unsteady on stairs and needs assist for safety  Wheelchair Mobility    Modified Rankin (Stroke Patients Only)       Balance Overall balance  assessment: Needs assistance   Sitting balance-Leahy Scale: Good     Standing balance support: No upper extremity supported;During functional activity Standing balance-Leahy Scale: Fair                      Cognition Arousal/Alertness: Awake/alert Behavior During Therapy: WFL for tasks assessed/performed Overall Cognitive Status: Within Functional Limits for tasks assessed Area of Impairment: Safety/judgement     Memory: Decreased recall of precautions   Safety/Judgement: Decreased awareness of safety     General Comments: Pt not following cervical precautions and when cued states "this way is easier'    Exercises      General Comments General comments (skin integrity, edema, etc.): reinforced cervical care/precautions log roll and side to sit, progression of activity.      Pertinent Vitals/Pain Pain Assessment:  (Knee pain especially L, Not rated) Faces Pain Scale: No hurt    Home Living                      Prior Function            PT Goals (current goals can now be found in the care plan section) Acute Rehab PT Goals Patient Stated Goal: go home PT Goal Formulation: With patient Time For Goal Achievement: 09/07/13 Potential to Achieve Goals: Good Progress towards PT goals: Progressing toward goals    Frequency  Min 5X/week    PT Plan Current plan remains appropriate    Co-evaluation             End of Session   Activity Tolerance: Patient tolerated treatment well Patient left: in bed  Time: 7829-5621 PT Time Calculation (min): 21 min  Charges:  $Gait Training: 8-22 mins                    G Codes:      Damarcus Reggio, Eliseo Gum 09/01/2013, 4:29 PM 09/01/2013  [place] Bing, PT [phone]  (pager)

## 2013-09-01 NOTE — Procedures (Signed)
Objective Swallowing Evaluation: Modified Barium Swallowing Study  Patient Details  Name: Albert Bond MRN: [id number] Date of Birth: 01/31/1950  Today's Date: 09/01/2013 Time: 1324-4010 SLP Time Calculation (min): 20 min  Past Medical History:  Past Medical History  Diagnosis Date  . Back pain    Past Surgical History:  Past Surgical History  Procedure Laterality Date  . Anterior cervical decomp/discectomy fusion N/A 08/30/2013    Procedure: ANTERIOR CERVICAL DECOMPRESSION/DISCECTOMY FUSION 2 LEVELS;  Surgeon: Lisbeth Renshaw, MD;  Location: [facility];  Service: Neurosurgery;  Laterality: N/A;  C34 C45 anterior cervical decompression with fusion interbody prosthesis plating and bonegraft   HPI:  63 yo male with c/o syncope, pt not certain how long he was out for. 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 Cervical canal stenosis. PMHx: back pain, alcohol abuse, remote lacunar infarct, Hx of ETOH use. On CIWA protocol. ACDF 08/30/2013.     Assessment / Plan / Recommendation Clinical Impression  Dysphagia Diagnosis: Moderate pharyngeal phase dysphagia;Moderate cervical esophageal phase dysphagia Clinical impression: Patient presents with a moderate pharyngeal and cervical esophageal dysphagia, which appears primarily impacted by decreased relaxation of the UES to allow bolus material to transit through the pharynx and into the esophagus. This results in moderate-severe pharyngeal residuals at the vallecuale and UES, which pt remains at risk for aspiration after the swallow. Increased residuals are noted with liquids as compared to solids, likely due to increased weight facilitating entrance into the esophagus. Min verbal cueing from SLP for a more effortful swallow was succesful at reducing residuals with pureed solids and nectar thick liquids, as was pt's reflexive additional swallows. Trace, shallow penetration was noted  during the swallow with nectar thick liquids x1, although thin liquids resulted in deep penetration that fortunately was not aspirated secondary to patient's cough response. Recommend Dys 1 textures and nectar thick liquids with full supervision for utilization of multiple, effortful swallows per bolus. SLP to continue to follow for tolerance, use of strategies, and readiness to advance.    Treatment Recommendation  Therapy as outlined in treatment plan below    Diet Recommendation Dysphagia 1 (Puree);Nectar-thick liquid   Liquid Administration via: Cup;No straw Medication Administration: Crushed with puree Supervision: Patient able to self feed;Full supervision/cueing for compensatory strategies Compensations: Slow rate;Small sips/bites;Clear throat intermittently;Effortful swallow Postural Changes and/or Swallow Maneuvers: Seated upright 90 degrees;Upright 30-60 min after meal    Other  Recommendations Recommended Consults: MBS Oral Care Recommendations: Oral care BID Other Recommendations: Order thickener from pharmacy;Prohibited food (jello, ice cream, thin soups);Remove water pitcher   Follow Up Recommendations  Home health SLP    Frequency and Duration min 2x/week  2 weeks   Pertinent Vitals/Pain Temperature spikes over 100, CXR without signs of acute infection    SLP Swallow Goals     General Date of Onset: 08/30/13 HPI: 64 yo male with c/o syncope, pt not certain how long he was out for. 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 Cervical canal stenosis. PMHx: back pain, alcohol abuse, remote lacunar infarct, Hx of ETOH use. On CIWA protocol. ACDF 08/30/2013. Type of Study: Modified Barium Swallowing Study Reason for Referral: Objectively evaluate swallowing function Previous Swallow Assessment: BSE earlier today positive for signs of aspiration and dysphagia Diet Prior to this Study: Dysphagia 1 (puree);Nectar-thick  liquids Temperature Spikes Noted: Yes Respiratory Status: Room air History of Recent Intubation: No Behavior/Cognition: Alert;Cooperative;Pleasant mood Oral  Cavity - Dentition: Poor condition;Missing dentition ("I only have 6 teeth") Oral Motor / Sensory Function: Within functional limits (impacted at baseline due to missing dentition) Self-Feeding Abilities: Able to feed self Patient Positioning: Upright in chair Baseline Vocal Quality: Hoarse;Low vocal intensity Volitional Cough: Weak Volitional Swallow: Able to elicit Anatomy: Other (Comment) (hardware from recent ACDF noted) Pharyngeal Secretions: Not observed secondary MBS    Reason for Referral Objectively evaluate swallowing function   Oral Phase Oral Preparation/Oral Phase Oral Phase: WFL   Pharyngeal Phase Pharyngeal Phase Pharyngeal Phase: Impaired Pharyngeal - Nectar Pharyngeal - Nectar Cup: Reduced pharyngeal peristalsis;Penetration/Aspiration during swallow;Pharyngeal residue - valleculae;Pharyngeal residue - cp segment;Compensatory strategies attempted (Comment) (chin tuck worsened ability to transit bolus through pharynx) Penetration/Aspiration details (nectar cup): Material enters airway, remains ABOVE vocal cords and not ejected out Pharyngeal - Thin Pharyngeal - Thin Cup: Reduced pharyngeal peristalsis;Penetration/Aspiration during swallow;Pharyngeal residue - valleculae;Pharyngeal residue - cp segment Penetration/Aspiration details (thin cup): Material enters airway, CONTACTS cords and not ejected out Pharyngeal - Solids Pharyngeal - Puree: Reduced pharyngeal peristalsis;Pharyngeal residue - valleculae;Pharyngeal residue - cp segment Pharyngeal - Mechanical Soft: Reduced pharyngeal peristalsis;Pharyngeal residue - valleculae;Pharyngeal residue - cp segment  Cervical Esophageal Phase    GO    Cervical Esophageal Phase Cervical Esophageal Phase: Impaired Cervical Esophageal Phase - Nectar Nectar Cup: Reduced  cricopharyngeal relaxation;Esophageal backflow into the pharynx Cervical Esophageal Phase - Thin Thin Cup: Reduced cricopharyngeal relaxation;Esophageal backflow into the pharynx Cervical Esophageal Phase - Solids Puree: Reduced cricopharyngeal relaxation Mechanical Soft: Reduced cricopharyngeal relaxation        Maxcine Ham, M.A. CCC-SLP [phone]  Maxcine Ham 09/01/2013, 3:39 PM

## 2013-09-01 NOTE — Evaluation (Signed)
Clinical/Bedside Swallow Evaluation Patient Details  Name: Albert Bond MRN: [id number] Date of Birth: 11/17/1950  Today's Date: 09/01/2013 Time: 4098-1191 SLP Time Calculation (min): 22 min  Past Medical History:  Past Medical History  Diagnosis Date  . Back pain    Past Surgical History:  Past Surgical History  Procedure Laterality Date  . Anterior cervical decomp/discectomy fusion N/A 08/30/2013    Procedure: ANTERIOR CERVICAL DECOMPRESSION/DISCECTOMY FUSION 2 LEVELS;  Surgeon: Lisbeth Renshaw, MD;  Location: [facility];  Service: Neurosurgery;  Laterality: N/A;  C34 C45 anterior cervical decompression with fusion interbody prosthesis plating and bonegraft   HPI:  63 yo male with c/o syncope, pt not certain how long he was out for. 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 Cervical canal stenosis. PMHx: back pain, alcohol abuse, remote lacunar infarct, Hx of ETOH use. On CIWA protocol. ACDF 08/30/2013.   Assessment / Plan / Recommendation Clinical Impression  Pt s/p ACDF reports new onset of oyndophagia and difficulty swallowing solids and liquids. Patient coughed immediately after cup sips of water, with 7-8 swallows per sip with facial grimacing. SLP intervention included thickening of liquids, which eliminated immediate cough response. Dys 1 textures and nectar thick lqiuids yielded decreased additional swallows and facial grimacing, however a delayed cough may be indicative of residuals remaining in pharynx. Recommend to modify diet to Dys 1 textures and nectar thick liquids until MBS can be completed.    Aspiration Risk  Moderate    Diet Recommendation Dysphagia 1 (Puree);Nectar-thick liquid   Liquid Administration via: Cup;No straw Medication Administration: Whole meds with puree (crushed PRN) Supervision: Patient able to self feed;Intermittent supervision to cue for compensatory strategies Compensations:  Slow rate;Small sips/bites Postural Changes and/or Swallow Maneuvers: Seated upright 90 degrees    Other  Recommendations Recommended Consults: MBS Oral Care Recommendations: Oral care BID Other Recommendations: Order thickener from pharmacy;Prohibited food (jello, ice cream, thin soups);Remove water pitcher   Follow Up Recommendations  Home health SLP    Frequency and Duration        Pertinent Vitals/Pain Temperature spikes as high as 102.4, CXR 8/27 without acute infection noted    SLP Swallow Goals     Swallow Study Prior Functional Status       General Date of Onset: 08/30/13 HPI: 63 yo male with c/o syncope, pt not certain how long he was out for. 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 Cervical canal stenosis. PMHx: back pain, alcohol abuse, remote lacunar infarct, Hx of ETOH use. On CIWA protocol. ACDF 08/30/2013. Type of Study: Bedside swallow evaluation Previous Swallow Assessment: none in chart Diet Prior to this Study: Dysphagia 3 (soft);Thin liquids Temperature Spikes Noted: Yes Respiratory Status: Room air History of Recent Intubation: No Behavior/Cognition: Alert;Cooperative;Pleasant mood Self-Feeding Abilities: Able to feed self Patient Positioning: Upright in chair Baseline Vocal Quality: Hoarse Volitional Cough: Weak Volitional Swallow: Able to elicit    Oral/Motor/Sensory Function Overall Oral Motor/Sensory Function: Appears within functional limits for tasks assessed   Ice Chips Ice chips: Not tested   Thin Liquid Thin Liquid: Impaired Presentation: Cup;Self Fed Pharyngeal  Phase Impairments: Multiple swallows;Cough - Immediate;Other (comments) (fracial grimacing, reports of pain with swallow)    Nectar Thick Nectar Thick Liquid: Impaired Presentation: Cup;Self Fed Pharyngeal Phase Impairments: Multiple swallows;Cough - Delayed   Honey Thick Honey Thick Liquid: Not tested   Puree Puree:  Impaired Presentation: Self Fed;Spoon Pharyngeal Phase  Impairments: Multiple swallows;Cough - Delayed   Solid   GO    Solid: Not tested        Maxcine Ham, M.A. CCC-SLP [phone]  Maxcine Ham 09/01/2013,1:46 PM

## 2013-09-01 NOTE — Progress Notes (Signed)
Pt febrile last night. Currently no significant complaints. Able to tolerate thin liquids, but still difficult to swallow soft foods. Able to ambulate with walker with PT.  EXAM:  BP 116/84  Pulse 89  Temp(Src) 98 F (36.7 C) (Oral)  Resp 20  Ht  (1.702 m)  Wt 63.957 kg (141 lb)  BMI 22.08 kg/m2  SpO2 100%  Awake, alert, oriented  Speech fluent, appropriate  CN grossly intact  Stable BUE and BLE weakness.  LABS: WBC 9 CXR no pna UA neg  IMPRESSION:  63 y.o. male POD#2 s/p ACDF, neurologically stable with postop dysphagia  PLAN: - diet per SLP recs - Cont PT/OT - Incentive spirometer - Dispo likely home with [facility]

## 2013-09-02 LAB — URINE CULTURE: Colony Count: 2000

## 2013-09-02 MED ORDER — FOLIC ACID 1 MG PO TABS: 1.0000 mg | ORAL_TABLET | Freq: Every day | ORAL | Status: DC

## 2013-09-02 MED ORDER — OXYCODONE-ACETAMINOPHEN 10-325 MG PO TABS: 1.0000 | ORAL_TABLET | ORAL | Status: DC | PRN

## 2013-09-02 MED ORDER — ASPIRIN EC 81 MG PO TBEC: 81.0000 mg | DELAYED_RELEASE_TABLET | Freq: Every day | ORAL | Status: DC

## 2013-09-02 MED ORDER — RESOURCE THICKENUP CLEAR PO POWD: ORAL | Status: DC

## 2013-09-02 NOTE — Discharge Summary (Signed)
Physician Discharge Summary  Patient ID: Albert Bond MRN: [id number] DOB/AGE: [birth year] 63 y.o.  Admit date: 08/24/2013 Discharge date: 09/02/2013  Admission Diagnoses: Cervicaly spondylosis with myelopathy  Discharge Diagnoses: Same Principal Problem:   Left-sided weakness Active Problems:   Syncope   Anemia Dysphagia  Discharged Condition: Stable  Hospital Course:  Mrs. Albert Bond is a 63 y.o. male initially admitted to the hospital with left-sided weakness. Workup included an MRI of the brain for stroke, as well as C-spine. The cervical spine MRI did demonstrate fairly severe cord compression, and the patient's symptoms were likely due to cervical myelopathy. He was therefore  scheduled for anterior cervical discectomy for decompression. The surgery was done without complication, and he was returned to the general neuroscience floor postoperatively where he was at his neurologic baseline with mild bilateral upper extremity weakness, and more proximal bilateral lower extremity weakness. He was seen by physical and occupational therapy, and was deemed safe to go home with home health services. Postoperatively, he did have some dysphasia, and was seen by speech-language pathology and was recommended to be a dysphagia diet. From the speech standpoint, he was safe to go home with home SLP therapy. He was otherwise doing well, with pain under control, and voiding normally.  Treatments: Surgery - ACDF at C3-4 and C4-5  Discharge Exam: Blood pressure 129/83, pulse 93, temperature 98.4 F (36.9 C), temperature source Oral, resp. rate 20, height  (1.702 m), weight 63.957 kg (141 lb), SpO2 95.00%. Awake, alert, oriented Speech fluent, appropriate CN grossly intact 4/5 proximal BUE, 3/5 grip 4/5 proximal BLE, 4+/5 distal BLE Wound c/d/i no leak  Follow-up: Follow-up in my office [facility] [phone]) in 2 weeks  Disposition: 01-Home or Self  Care  Discharge Instructions   Ambulatory referral to Home Health    Complete by:  As directed   Please evaluate Albert Bond for admission to Home Health.  Disciplines requested: Physical Therapy  Services to provide: Strengthening Exercises, Evaluate and Other: SLP evaluation/therapy  Physician to follow patient's care (the person listed here will be responsible for signing ongoing orders): Referring Provider  Requested Start of Care Date: Tomorrow  I certify that this patient is under my care and that I, or a Nurse Practitioner or Physician's Assistant working with me, had a face-to-face encounter that meets the physician face-to-face requirements with patient on 09/02/13. The encounter with the patient was in whole, or in part for the following medical condition(s) which is the primary reason for home health care (List medical condition). Cervical spondylosis with myelopathy, dysphagia s/p ACDF  Special Instructions:  None  The encounter with the patient was in whole, or in part, for the following medical condition, which is the primary reason for home health care:  cervical spondylosis with myelopathy  I certify that, based on my findings, the following services are medically necessary home health services:   Physical therapy Speech language pathology    My clinical findings support the need for the above services:  Unsafe ambulation due to balance issues  Further, I certify that my clinical findings support that this patient is homebound due to:  Unsafe ambulation due to balance issues  Reason for Medically Necessary Home Health Services:   Therapy- Instruction use of Assistive Devices for Safe Swallowing Therapy- Therapeutic Exercises to Increase Strength and Endurance    Does the patient have Medicare or Medicaid?:  No  Medication List         aspirin EC 81 MG tablet  Take 1 tablet (81 mg total) by mouth daily.  Start taking on:  09/09/2013     folic  acid 1 MG tablet  Commonly known as:  FOLVITE  Take 1 tablet (1 mg total) by mouth daily.     oxyCODONE-acetaminophen 10-325 MG per tablet  Commonly known as:  PERCOCET  Take 1 tablet by mouth every 4 (four) hours as needed for pain.     RESOURCE THICKENUP CLEAR Powd  Use as directed by SLP for thickening of liquids           Follow-up Information   Schedule an appointment as soon as possible for a visit with [facility].   Contact information:   [address] [place] [address] [phone]      Signed: Jackelyn Hoehn 09/02/2013, 2:43 PM

## 2013-09-02 NOTE — Progress Notes (Addendum)
Physical Therapy Treatment Patient Details Name: Charley Miske MRN: [id number] DOB: 1951/01/03 Today's Date: 09/02/2013    History of Present Illness 63 yo male with c/o syncope, pt not certain how long he was out for.  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  Cervical canal stenosis.  PMHx:  back pain, alcohol abuse, remote lacunar infarct, Hx of ETOH use. On CIWA protocol.. ACDF 08/30/2013    PT Comments    Pt not aware of cervical precautions and how they will impact him at home even though education has been reinforced several times.  Follow Up Recommendations  Supervision/Assistance - 24 hour;Home health PT     Equipment Recommendations  None recommended by PT    Recommendations for Other Services Rehab consult     Precautions / Restrictions Precautions Precautions: Fall Restrictions Weight Bearing Restrictions: No    Mobility  Bed Mobility Overal bed mobility: Needs Assistance Bed Mobility: Rolling;Sidelying to Sit;Sit to Sidelying Rolling: Supervision Sidelying to sit: Supervision       General bed mobility comments: reinforced safe technique; no assist needed  Transfers Overall transfer level: Needs assistance Equipment used: Rolling walker (2 wheeled) Transfers: Sit to/from Stand Sit to Stand: Min guard         General transfer comment: cues for safe hand placement  Ambulation/Gait Ambulation/Gait assistance: Min guard Ambulation Distance (Feet): 300 Feet Assistive device: Rolling walker (2 wheeled);None Gait Pattern/deviations: Step-through pattern;Shuffle Gait velocity: slow   General Gait Details: generally steady gait with heavy use of UE's   Stairs            Wheelchair Mobility    Modified Rankin (Stroke Patients Only)       Balance Overall balance assessment: Needs assistance Sitting-balance support: No upper extremity supported Sitting balance-Leahy Scale:  Good Sitting balance - Comments: able to donn shirt, underwear and pants.  Could get to his feet without  neck flexion   Standing balance support: No upper extremity supported Standing balance-Leahy Scale: Fair Standing balance comment: tended to rely on calves against bed for stability                    Cognition Arousal/Alertness: Awake/alert Behavior During Therapy: WFL for tasks assessed/performed Overall Cognitive Status: Impaired/Different from baseline Area of Impairment: Safety/judgement     Memory: Decreased recall of precautions         General Comments: pt not following cervical precautions.     Exercises      General Comments General comments (skin integrity, edema, etc.): reinforced basic cervical education for carryover.      Pertinent Vitals/Pain Pain Assessment: No/denies pain    Home Living                      Prior Function            PT Goals (current goals can now be found in the care plan section) Acute Rehab PT Goals Patient Stated Goal: go home PT Goal Formulation: With patient Time For Goal Achievement: 09/07/13 Potential to Achieve Goals: Good Progress towards PT goals: Progressing toward goals    Frequency  Min 5X/week    PT Plan Current plan remains appropriate    Co-evaluation             End of Session   Activity Tolerance: Patient tolerated treatment well Patient left: in chair;with family/visitor present     Time: 6213-0865 PT  Time Calculation (min): 23 min  Charges:  $Gait Training: 8-22 mins $Therapeutic Activity: 8-22 mins                    G Codes:      Denaly Gatling, Eliseo Gum 09/02/2013, 5:13 PM 09/02/2013  [place] Bing, PT [phone]  (pager)

## 2013-09-02 NOTE — Progress Notes (Signed)
Pt admitted with Syncope S/P ACDF; D/C home with family to have PT/OT at home set up with Genev[facility] Norlander, per Care Management.

## 2013-09-02 NOTE — Progress Notes (Signed)
Speech Language Pathology Treatment: Dysphagia  Patient Details Name: Albert Bond MRN: [id number] DOB: [birth year] Today's Date: 09/02/2013 Time: 4098-1191 SLP Time Calculation (min): 21 min  Assessment / Plan / Recommendation Clinical Impression  Patient reports that his swallowing feels mildly improved today, although he still had facial grimacing throughout lunch meal. Pt required Min cues from SLP for recall and utilization of recommended compensatory strategies. With use of strategies, pt still demonstrated a delayed cough x1, which may be related to baseline cough versus penetration of residuals. Pt's cough during MBS was effective at clearing penetrates. Recommend to continue current textures at this time with SLP f/u to assess tolerance and readiness to advance.   HPI HPI: 63 yo male with c/o syncope, pt not certain how long he was out for. 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 Cervical canal stenosis. PMHx: back pain, alcohol abuse, remote lacunar infarct, Hx of ETOH use. On CIWA protocol. ACDF 08/30/2013.   Pertinent Vitals Pain Assessment: No/denies pain  SLP Plan  Continue with current plan of care    Recommendations Diet recommendations: Dysphagia 1 (puree);Nectar-thick liquid Liquids provided via: Cup;No straw Medication Administration: Crushed with puree Supervision: Patient able to self feed;Full supervision/cueing for compensatory strategies Compensations: Slow rate;Small sips/bites;Clear throat intermittently;Effortful swallow Postural Changes and/or Swallow Maneuvers: Seated upright 90 degrees;Upright 30-60 min after meal              Oral Care Recommendations: Oral care BID Follow up Recommendations: Home health SLP Plan: Continue with current plan of care    GO      Maxcine Ham, M.A. CCC-SLP [phone]  Maxcine Ham 09/02/2013, 1:06 PM

## 2013-09-02 NOTE — Progress Notes (Signed)
Occupational Therapy Treatment Patient Details Name: Albert Bond MRN: [id number] DOB: 05-Dec-1950 Today's Date: 09/02/2013    History of present illness 63 yo male with c/o syncope, pt not certain how long he was out for.  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  Cervical canal stenosis.  PMHx:  back pain, alcohol abuse, remote lacunar infarct, Hx of ETOH use. On CIWA protocol.. ACDF 08/30/2013   OT comments  Pt demonstrates HEP program with theraputty. Pt likes to joke and in very good spirits this morning. Pt needed encouragement to attempt eating this AM. [place] and suction applied in room. Pt c/o constant secretions and asking "how do we make it stop?"   Follow Up Recommendations  Home health OT;Supervision/Assistance - 24 hour    Equipment Recommendations  Other (comment)    Recommendations for Other Services      Precautions / Restrictions Precautions Precautions: Fall Precaution Comments: encouraged  cervical precautions       Mobility Bed Mobility               General bed mobility comments: Pt required mod (A) to slide to [facility] for proper positioning to eat ice cream  Transfers                      Balance                                   ADL Overall ADL's : Needs assistance/impaired Eating/Feeding: Set up;Sitting Eating/Feeding Details (indicate cue type and reason): pt provided red foam for self feeding. pt reports "thats much better"                                   General ADL Comments: Session focused on UE adaptation for self feeding. pt provided foam and blue mug with handle. pt provided tan putty for hand HEP.      Vision                     Perception     Praxis      Cognition   Behavior During Therapy: WFL for tasks assessed/performed Overall Cognitive Status: Impaired/Different from baseline                   General Comments: pt not following cervical precautions.     Extremity/Trunk Assessment               Exercises Other Exercises Other Exercises: AROM stretch prayer- pt recalled this exercises with cueing Other Exercises: AROM putty tan HEP- squeeze, twist, roll into ball, roll into long (passive extension stretch) pinch    Shoulder Instructions       General Comments      Pertinent Vitals/ Pain       Pain Assessment:  (reports difficulty with swallow)  Home Living                                          Prior Functioning/Environment              Frequency Min 3X/week     Progress Toward Goals  OT Goals(current goals can now be found  in the care plan section)  Progress towards OT goals: Progressing toward goals  Acute Rehab OT Goals Patient Stated Goal: go home OT Goal Formulation: With patient Time For Goal Achievement: 09/08/13 Potential to Achieve Goals: Good ADL Goals Pt Will Perform Eating: with modified independence;sitting;with adaptive utensils Pt Will Perform Grooming: with adaptive equipment;standing;with supervision Pt Will Perform Lower Body Bathing: with supervision;sit to/from stand Pt Will Perform Upper Body Dressing: with modified independence;sitting Pt Will Perform Lower Body Dressing: with supervision;sit to/from stand;with adaptive equipment Pt Will Transfer to Toilet: with supervision;bedside commode;ambulating Pt/caregiver will Perform Home Exercise Program: Both right and left upper extremity;With theraputty;With written HEP provided  Plan Discharge plan remains appropriate    Co-evaluation                 End of Session     Activity Tolerance Patient tolerated treatment well   Patient Left in bed;with call bell/phone within reach;with bed alarm set   Nurse Communication Mobility status        Time: 9604-5409 OT Time Calculation (min): 33 min  Charges: OT General Charges $OT Visit: 1  Procedure OT Treatments $Therapeutic Exercise: 23-37 mins  Harolyn Rutherford 09/02/2013, 11:24 AM Pager: [phone]

## 2013-09-03 NOTE — Anesthesia Postprocedure Evaluation (Signed)
  Anesthesia Post-op Note  Patient: Albert Bond [facility]  Procedure(s) Performed: Procedure(s) with comments: ANTERIOR CERVICAL DECOMPRESSION/DISCECTOMY FUSION 2 LEVELS (N/A) - C34 C45 anterior cervical decompression with fusion interbody prosthesis plating and bonegraft  Patient Location: PACU  Anesthesia Type:General  Level of Consciousness: awake  Airway and Oxygen Therapy: Patient Spontanous Breathing  Post-op Pain: mild  Post-op Assessment: Post-op Vital signs reviewed, Patient's Cardiovascular Status Stable, Respiratory Function Stable, Patent Airway and Pain level controlled  Post-op Vital Signs: Reviewed and stable  Last Vitals:  Filed Vitals:   09/02/13 1356  BP: 129/83  Pulse: 93  Temp: 36.9 C  Resp: 20    Complications: No apparent anesthesia complications

## 2013-09-07 LAB — CULTURE, BLOOD (ROUTINE X 2)
Culture: NO GROWTH
Culture: NO GROWTH

## 2013-09-08 ENCOUNTER — Ambulatory Visit: Payer: Medicaid Other | Attending: Internal Medicine | Admitting: Internal Medicine

## 2013-09-08 ENCOUNTER — Encounter: Payer: Self-pay | Admitting: Internal Medicine

## 2013-09-08 VITALS — BP 120/90 | HR 79 | Temp 98.8°F | Resp 16 | Ht 67.0 in | Wt 138.0 lb

## 2013-09-08 DIAGNOSIS — M6281 Muscle weakness (generalized): Secondary | ICD-10-CM

## 2013-09-08 DIAGNOSIS — R1310 Dysphagia, unspecified: Secondary | ICD-10-CM | POA: Diagnosis present

## 2013-09-08 DIAGNOSIS — F1010 Alcohol abuse, uncomplicated: Secondary | ICD-10-CM | POA: Diagnosis present

## 2013-09-08 DIAGNOSIS — Z9889 Other specified postprocedural states: Secondary | ICD-10-CM | POA: Insufficient documentation

## 2013-09-08 DIAGNOSIS — M549 Dorsalgia, unspecified: Secondary | ICD-10-CM | POA: Insufficient documentation

## 2013-09-08 DIAGNOSIS — F17200 Nicotine dependence, unspecified, uncomplicated: Secondary | ICD-10-CM | POA: Diagnosis not present

## 2013-09-08 DIAGNOSIS — M4802 Spinal stenosis, cervical region: Secondary | ICD-10-CM | POA: Diagnosis not present

## 2013-09-08 DIAGNOSIS — R29898 Other symptoms and signs involving the musculoskeletal system: Secondary | ICD-10-CM | POA: Diagnosis not present

## 2013-09-08 DIAGNOSIS — Z7982 Long term (current) use of aspirin: Secondary | ICD-10-CM | POA: Diagnosis not present

## 2013-09-08 DIAGNOSIS — F1210 Cannabis abuse, uncomplicated: Secondary | ICD-10-CM | POA: Diagnosis not present

## 2013-09-08 DIAGNOSIS — Z23 Encounter for immunization: Secondary | ICD-10-CM | POA: Diagnosis not present

## 2013-09-08 DIAGNOSIS — R531 Weakness: Secondary | ICD-10-CM

## 2013-09-08 NOTE — Patient Instructions (Signed)
DASH Eating Plan °DASH stands for "Dietary Approaches to Stop Hypertension." The DASH eating plan is a healthy eating plan that has been shown to reduce high blood pressure (hypertension). Additional health benefits may include reducing the risk of type 2 diabetes mellitus, heart disease, and stroke. The DASH eating plan may also help with weight loss. °WHAT DO I NEED TO KNOW ABOUT THE DASH EATING PLAN? °For the DASH eating plan, you will follow these general guidelines: °· Choose foods with a percent daily value for sodium of less than 5% (as listed on the food label). °· Use salt-free seasonings or herbs instead of table salt or sea salt. °· Check with your health care provider or pharmacist before using salt substitutes. °· Eat lower-sodium products, often labeled as "lower sodium" or "no salt added." °· Eat fresh foods. °· Eat more vegetables, fruits, and low-fat dairy products. °· Choose whole grains. Look for the word "whole" as the first word in the ingredient list. °· Choose fish and skinless chicken or turkey more often than red meat. Limit fish, poultry, and meat to 6 oz (170 g) each day. °· Limit sweets, desserts, sugars, and sugary drinks. °· Choose heart-healthy fats. °· Limit cheese to 1 oz (28 g) per day. °· Eat more home-cooked food and less restaurant, buffet, and fast food. °· Limit fried foods. °· Cook foods using methods other than frying. °· Limit canned vegetables. If you do use them, rinse them well to decrease the sodium. °· When eating at a restaurant, ask that your food be prepared with less salt, or no salt if possible. °WHAT FOODS CAN I EAT? °Seek help from a dietitian for individual calorie needs. °Grains °Whole grain or whole wheat bread. Brown rice. Whole grain or whole wheat pasta. Quinoa, bulgur, and whole grain cereals. Low-sodium cereals. Corn or whole wheat flour tortillas. Whole grain cornbread. Whole grain crackers. Low-sodium crackers. °Vegetables °Fresh or frozen vegetables  (raw, steamed, roasted, or grilled). Low-sodium or reduced-sodium tomato and vegetable juices. Low-sodium or reduced-sodium tomato sauce and paste. Low-sodium or reduced-sodium canned vegetables.  °Fruits °All fresh, canned (in natural juice), or frozen fruits. °Meat and Other Protein Products °Ground beef (85% or leaner), grass-fed beef, or beef trimmed of fat. Skinless chicken or turkey. Ground chicken or turkey. Pork trimmed of fat. All fish and seafood. Eggs. Dried beans, peas, or lentils. Unsalted nuts and seeds. Unsalted canned beans. °Dairy °Low-fat dairy products, such as skim or 1% milk, 2% or reduced-fat cheeses, low-fat ricotta or cottage cheese, or plain low-fat yogurt. Low-sodium or reduced-sodium cheeses. °Fats and Oils °Tub margarines without trans fats. Light or reduced-fat mayonnaise and salad dressings (reduced sodium). Avocado. Safflower, olive, or canola oils. Natural peanut or almond butter. °Other °Unsalted popcorn and pretzels. °The items listed above may not be a complete list of recommended foods or beverages. Contact your dietitian for more options. °WHAT FOODS ARE NOT RECOMMENDED? °Grains °White bread. White pasta. White rice. Refined cornbread. Bagels and croissants. Crackers that contain trans fat. °Vegetables °Creamed or fried vegetables. Vegetables in a cheese sauce. Regular canned vegetables. Regular canned tomato sauce and paste. Regular tomato and vegetable juices. °Fruits °Dried fruits. Canned fruit in light or heavy syrup. Fruit juice. °Meat and Other Protein Products °Fatty cuts of meat. Ribs, chicken wings, bacon, sausage, bologna, salami, chitterlings, fatback, hot dogs, bratwurst, and packaged luncheon meats. Salted nuts and seeds. Canned beans with salt. °Dairy °Whole or 2% milk, cream, half-and-half, and cream cheese. Whole-fat or sweetened yogurt. Full-fat   cheeses or blue cheese. Nondairy creamers and whipped toppings. Processed cheese, cheese spreads, or cheese  curds. °Condiments °Onion and garlic salt, seasoned salt, table salt, and sea salt. Canned and packaged gravies. Worcestershire sauce. Tartar sauce. Barbecue sauce. Teriyaki sauce. Soy sauce, including reduced sodium. Steak sauce. Fish sauce. Oyster sauce. Cocktail sauce. Horseradish. Ketchup and mustard. Meat flavorings and tenderizers. Bouillon cubes. Hot sauce. Tabasco sauce. Marinades. Taco seasonings. Relishes. °Fats and Oils °Butter, stick margarine, lard, shortening, ghee, and bacon fat. Coconut, palm kernel, or palm oils. Regular salad dressings. °Other °Pickles and olives. Salted popcorn and pretzels. °The items listed above may not be a complete list of foods and beverages to avoid. Contact your dietitian for more information. °WHERE CAN I FIND MORE INFORMATION? °National Heart, Lung, and Blood Institute: www.nhlbi.nih.gov/health/health-topics/topics/dash/ °Document Released: 12/12/2010 Document Revised: 05/09/2013 Document Reviewed: 10/27/2012 °ExitCare® Patient Information ©2015 [employer]. This information is not intended to replace advice given to you by your health care provider. Make sure you discuss any questions you have with your health care provider. ° °

## 2013-09-08 NOTE — Progress Notes (Signed)
Patient ID: Albert Bond, male   DOB: 09-11-1950, 63 y.o.   MRN: [id number]  NWG:[id number]  [id number]:[id number]  DOB - 03-24-1950  CC:  Chief Complaint  Patient presents with  . Hospitalization Follow-up       HPI: Albert Bond is a 63 y.o. male here today to establish medical care.  Patient presents to clinic today as a hospital follow up for cervical decompression suregry.  He is due to follow up with neurosurgery in two weeks.  Since surgery he has been having left sided weakness, dysphagia, and difficulty swallowing.  He has been working with physical therapy and speech therapy.  He is on dysphagia thick diet at home.  He is currently using a walker for ambulation without difficulty.  He is a currently a .5 ppd smoker and lives with his sisters who are helping with his care.  Patient does admit to alcohol use but is unable to quantify the amount.    No Known Allergies Past Medical History  Diagnosis Date  . Back pain    Current Outpatient Prescriptions on File Prior to Visit  Medication Sig Dispense Refill  . folic acid (FOLVITE) 1 MG tablet Take 1 tablet (1 mg total) by mouth daily.  30 tablet  3  . oxyCODONE-acetaminophen (PERCOCET) 10-325 MG per tablet Take 1 tablet by mouth every 4 (four) hours as needed for pain.  30 tablet  0  . [START ON 09/09/2013] aspirin EC 81 MG tablet Take 1 tablet (81 mg total) by mouth daily.      . Maltodextrin-Xanthan Gum (RESOURCE THICKENUP CLEAR) POWD Use as directed by SLP for thickening of liquids  30 Can  3   No current facility-administered medications on file prior to visit.   Family History  Problem Relation Age of Onset  . CAD Mother   . CAD Father    History   Social History  . Marital Status: Married    Spouse Name: N/A    Number of Children: N/A  . Years of Education: N/A   Occupational History  . Not on file.   Social History Main Topics  . Smoking status: Current Every Day Smoker -- 0.50 packs/day for 40 years   Types: Cigarettes  . Smokeless tobacco: Never Used  . Alcohol Use: Yes     Comment: Heavily  . Drug Use: Yes    Special: Marijuana  . Sexual Activity: Not on file   Other Topics Concern  . Not on file   Social History Narrative  . No narrative on file    Review of Systems  Constitutional: Negative for weight loss and diaphoresis.  Eyes: Negative.   Respiratory: Negative.   Cardiovascular: Negative.   Gastrointestinal: Negative.   Genitourinary: Negative.   Musculoskeletal: Negative for back pain, falls and neck pain.       Right shoulder pain after surgery   Skin: Negative.   Neurological: Positive for focal weakness and weakness. Negative for dizziness, tingling, tremors and headaches.  Endo/Heme/Allergies: Negative.   Psychiatric/Behavioral: Negative.        Objective:   Filed Vitals:   09/08/13 0943  BP: 120/90  Pulse: 79  Temp: 98.8 F (37.1 C)  Resp: 16    Physical Exam: Constitutional: Patient appears well-developed and well-nourished. No distress. HENT: Normocephalic, atraumatic, External right and left ear normal. Oropharynx is clear and moist.  Eyes: Conjunctivae and EOM are normal. PERRLA, no scleral icterus. Neck: Normal ROM. Neck supple. No JVD. No tracheal deviation. No  thyromegaly. CVS: RRR, S1/S2 +, no murmurs, no gallops, no carotid bruit.  Pulmonary: Effort and breath sounds normal, no stridor, rhonchi, wheezes, rales.  Abdominal: Soft. BS +, no distension, tenderness, rebound or guarding.  Musculoskeletal: Normal range of motion. No edema and no tenderness.  Lymphadenopathy: No lymphadenopathy noted, cervical Neuro: Alert. Normal reflexes, muscle tone coordination. Bilateral 4+ muscle strength of upper extremities. 5+ of RLE and 4+of LLE.   No cranial nerve deficit. Skin: Skin is warm and dry. No rash noted. Not diaphoretic. No erythema. No pallor. Psychiatric: Normal mood and affect. Behavior, judgment, thought content normal.  Lab Results    Component Value Date   WBC 9.0 09/01/2013   HGB 12.4* 09/01/2013   HCT 36.7* 09/01/2013   MCV 88.9 09/01/2013   PLT 121* 09/01/2013   Lab Results  Component Value Date   CREATININE 0.84 08/24/2013   BUN 8 08/24/2013   NA 139 08/24/2013   K 4.2 08/24/2013   CL 101 08/24/2013   CO2 23 08/24/2013    No results found for this basename: HGBA1C   Lipid Panel  No results found for this basename: chol, trig, hdl, cholhdl, vldl, ldlcalc       Assessment and plan:   Klark was seen today for hospitalization follow-up.  Diagnoses and associated orders for this visit:  Left-sided weakness - TSH; Future - Hemoglobin A1c; Future - Lipid panel; Future  Need for prophylactic vaccination and inoculation against influenza Recieved   Tobacco use disorder Smoking cessations discussed     Return in about 1 week (around 09/15/2013) for Lab Visit.  The patient was given clear instructions to go to ER or return to medical center if symptoms don't improve, worsen or new problems develop. The patient verbalized understanding.   Holland Commons, NP-C [facility] [phone] 09/08/2013, 10:02 AM

## 2013-09-08 NOTE — Progress Notes (Signed)
Pt is here to establish care per HFU- Left sided weakness, admitted to Neuro- Cervical spine stenosis Pt ambulates with walker without difficulty Denies diff swallowing or numbness/tingling sensation Left side weakness noted with hand grasp,alert/oriented x 3 Admits to smoking 1 pack cigarettes/daily Last drink was with hospital admission- refused social support for sobriety  Flu vaccine requested

## 2013-09-12 DIAGNOSIS — F17200 Nicotine dependence, unspecified, uncomplicated: Secondary | ICD-10-CM | POA: Insufficient documentation

## 2013-09-15 ENCOUNTER — Ambulatory Visit: Payer: Self-pay | Attending: Internal Medicine

## 2013-09-15 DIAGNOSIS — R531 Weakness: Secondary | ICD-10-CM

## 2013-09-15 LAB — LIPID PANEL
Cholesterol: 169 mg/dL (ref 0–200)
HDL: 25 mg/dL — AB (ref >39)
LDL CALC: 125 mg/dL — AB (ref 0–99)
Total CHOL/HDL Ratio: 6.8 Ratio
Triglycerides: 97 mg/dL (ref <150)
VLDL: 19 mg/dL (ref 0–40)

## 2013-09-15 LAB — HEMOGLOBIN A1C
HEMOGLOBIN A1C: 6 % — AB (ref <5.7)
MEAN PLASMA GLUCOSE: 126 mg/dL — AB (ref <117)

## 2013-09-16 LAB — TSH: TSH: 1.501 u[IU]/mL (ref 0.350–4.500)

## 2013-09-26 ENCOUNTER — Telehealth: Payer: Self-pay | Admitting: Emergency Medicine

## 2013-09-26 NOTE — Telephone Encounter (Signed)
Pt given lab results with education on diet/exercise modifications to prevent heart disease/Diabetes Pt verbalized understanding

## 2013-09-26 NOTE — Telephone Encounter (Signed)
Message copied by Darlis Loan on Mon Sep 26, 2013 12:39 PM ------      Message from: Holland Commons A      Created: Fri Sep 16, 2013  4:10 PM       Patient is high risk for developing diabetes.  Make sure to practice good lifestyle habits with diet and exercise. Cholesterol is slightly elevated ------

## 2014-02-04 NOTE — Addendum Note (Signed)
Addendum  created 02/04/14 1616 by Dairl PonderFudan Raeshaun Simson, CRNA   Modules edited: Anesthesia Medication Administration

## 2014-04-12 ENCOUNTER — Ambulatory Visit
Admission: RE | Admit: 2014-04-12 | Discharge: 2014-04-12 | Disposition: A | Discharge status: Home / Self Care | Payer: Medicaid Other | Source: Ambulatory Visit | Attending: Orthopedic Surgery | Admitting: Orthopedic Surgery

## 2014-04-12 ENCOUNTER — Other Ambulatory Visit: Payer: Self-pay | Admitting: Orthopedic Surgery

## 2014-04-12 DIAGNOSIS — M1712 Unilateral primary osteoarthritis, left knee: Secondary | ICD-10-CM

## 2015-06-30 ENCOUNTER — Emergency Department (HOSPITAL_COMMUNITY): Payer: Medicaid Other

## 2015-06-30 ENCOUNTER — Encounter (HOSPITAL_COMMUNITY): Payer: Self-pay | Admitting: Nurse Practitioner

## 2015-06-30 ENCOUNTER — Emergency Department (HOSPITAL_COMMUNITY)
Admission: EM | Admit: 2015-06-30 | Discharge: 2015-07-01 | Disposition: A | Discharge status: Home / Self Care | Payer: Medicaid Other | Attending: Emergency Medicine | Admitting: Emergency Medicine

## 2015-06-30 DIAGNOSIS — F10129 Alcohol abuse with intoxication, unspecified: Secondary | ICD-10-CM | POA: Diagnosis not present

## 2015-06-30 DIAGNOSIS — Y939 Activity, unspecified: Secondary | ICD-10-CM | POA: Diagnosis not present

## 2015-06-30 DIAGNOSIS — Y999 Unspecified external cause status: Secondary | ICD-10-CM | POA: Insufficient documentation

## 2015-06-30 DIAGNOSIS — W268XXA Contact with other sharp object(s), not elsewhere classified, initial encounter: Secondary | ICD-10-CM | POA: Insufficient documentation

## 2015-06-30 DIAGNOSIS — S6992XA Unspecified injury of left wrist, hand and finger(s), initial encounter: Secondary | ICD-10-CM | POA: Diagnosis present

## 2015-06-30 DIAGNOSIS — S61219A Laceration without foreign body of unspecified finger without damage to nail, initial encounter: Secondary | ICD-10-CM

## 2015-06-30 DIAGNOSIS — F17210 Nicotine dependence, cigarettes, uncomplicated: Secondary | ICD-10-CM | POA: Diagnosis not present

## 2015-06-30 DIAGNOSIS — F10929 Alcohol use, unspecified with intoxication, unspecified: Secondary | ICD-10-CM

## 2015-06-30 DIAGNOSIS — Y929 Unspecified place or not applicable: Secondary | ICD-10-CM | POA: Insufficient documentation

## 2015-06-30 DIAGNOSIS — S61217A Laceration without foreign body of left little finger without damage to nail, initial encounter: Secondary | ICD-10-CM | POA: Diagnosis not present

## 2015-06-30 DIAGNOSIS — W1839XA Other fall on same level, initial encounter: Secondary | ICD-10-CM | POA: Insufficient documentation

## 2015-06-30 MED ORDER — BUPIVACAINE HCL (PF) 0.25 % IJ SOLN
5.0000 mL | Freq: Once | INTRAMUSCULAR | Status: AC
  Administered 2015-06-30: 5 mL
  Filled 2015-06-30: qty 30

## 2015-06-30 MED ORDER — TETANUS-DIPHTH-ACELL PERTUSSIS 5-2.5-18.5 LF-MCG/0.5 IM SUSP
0.5000 mL | Freq: Once | INTRAMUSCULAR | Status: AC
  Administered 2015-06-30: 0.5 mL via INTRAMUSCULAR
  Filled 2015-06-30: qty 0.5

## 2015-06-30 NOTE — ED Notes (Signed)
Pt is presented by EMS for evaluation post fall secondary to etoh intoxication. Suspicion of head impact, injury left hand 5th digit.

## 2015-07-01 MED ORDER — CEPHALEXIN 500 MG PO CAPS: 500.0000 mg | ORAL_CAPSULE | Freq: Four times a day (QID) | ORAL | Status: DC

## 2015-07-01 NOTE — ED Provider Notes (Signed)
CSN: [id number]     Arrival date & time 06/30/15  1755 History   First MD Initiated Contact with Patient 06/30/15 1839     Chief Complaint  Patient presents with  . Fall  . Alcohol Intoxication     (Consider location/radiation/quality/duration/timing/severity/associated sxs/prior Treatment) HPI Comments: 65 y.o. Male presents for intoxication and fall.  The patient reports that he has been drinking heavily this week because it was his son's birthday and that today he fell forward on to his face.  He reports that his finger injury is from 4 days ago when he put his hand on a beer bottle.  Denies LOC, headache, nausea, vomiting.  No abdominal, neck, or back pain.   Past Medical History  Diagnosis Date  . Back pain    Past Surgical History  Procedure Laterality Date  . Anterior cervical decomp/discectomy fusion N/A 08/30/2013    Procedure: ANTERIOR CERVICAL DECOMPRESSION/DISCECTOMY FUSION 2 LEVELS;  Surgeon: Lisbeth RenshawNeelesh Nundkumar, MD;  Location: [facility];  Service: Neurosurgery;  Laterality: N/A;  C34 C45 anterior cervical decompression with fusion interbody prosthesis plating and bonegraft   Family History  Problem Relation Age of Onset  . CAD Mother   . CAD Father    Social History  Substance Use Topics  . Smoking status: Current Every Day Smoker -- 0.50 packs/day for 40 years    Types: Cigarettes  . Smokeless tobacco: Never Used  . Alcohol Use: Yes     Comment: Heavily    Review of Systems  Constitutional: Negative for fever, chills and fatigue.  HENT: Negative for congestion, postnasal drip, rhinorrhea and sinus pressure.   Eyes: Negative for visual disturbance.  Respiratory: Negative for cough, chest tightness and shortness of breath.   Cardiovascular: Negative for chest pain, palpitations and leg swelling.  Gastrointestinal: Negative for nausea, vomiting, abdominal pain and diarrhea.  Genitourinary: Negative for dysuria, hematuria and decreased urine volume.    Musculoskeletal: Negative for myalgias, back pain and neck pain.  Skin: Positive for wound (scratch to face, 5th digit of left hand).  Neurological: Negative for dizziness, seizures, syncope, weakness, light-headedness, numbness and headaches.  Hematological: Does not bruise/bleed easily.      Allergies  Review of patient's allergies indicates no known allergies.  Home Medications   Prior to Admission medications   Medication Sig Start Date End Date Taking? Authorizing Provider  cephALEXin (KEFLEX) 500 MG capsule Take 1 capsule (500 mg total) by mouth 4 (four) times daily. 07/01/15   Leta BaptistEmily Roe Nguyen, MD   BP 130/87 mmHg  Pulse 63  Temp(Src) 97.7 F (36.5 C) (Oral)  Resp 14  SpO2 100% Physical Exam  Constitutional: He is oriented to person, place, and time. He appears well-developed and well-nourished. No distress.  Appears intoxicated  HENT:  Head: Normocephalic and atraumatic.    Right Ear: External ear normal.  Left Ear: External ear normal.  Mouth/Throat: Oropharynx is clear and moist. No oropharyngeal exudate.  Eyes: EOM are normal. Pupils are equal, round, and reactive to light.  Neck: Normal range of motion. Neck supple.  Cardiovascular: Normal rate, regular rhythm, normal heart sounds and intact distal pulses.   No murmur heard. Pulmonary/Chest: Effort normal. No respiratory distress. He has no wheezes. He has no rales.  Abdominal: Soft. He exhibits no distension. There is no tenderness.  Musculoskeletal: He exhibits no edema.       Left hand: Normal sensation noted. Normal strength noted.       Hands: Significant swelling of the  left 5th digit of the hand  Neurological: He is alert and oriented to person, place, and time.  Skin: Skin is warm and dry. No rash noted. He is not diaphoretic.  Vitals reviewed.   ED Course  Procedures (including critical care time) Labs Review Labs Reviewed - No data to display  Imaging Review Ct Head Wo Contrast  06/30/2015   CLINICAL DATA:  Trauma. EXAM: CT HEAD WITHOUT CONTRAST CT CERVICAL SPINE WITHOUT CONTRAST TECHNIQUE: Multidetector CT imaging of the head and cervical spine was performed following the standard protocol without intravenous contrast. Multiplanar CT image reconstructions of the cervical spine were also generated. COMPARISON:  None. FINDINGS: CT HEAD FINDINGS Paranasal sinuses, mastoid air cell, and bones are normal. Extracranial soft tissues are within normal limits. No subdural, epidural, or subarachnoid hemorrhage. No mass, mass effect, or midline shift. The cerebellum, brainstem, and basal cisterns are normal. Calcifications are seen in the basal ganglia. An age-indeterminate lacunar infarct is seen in the right caudate on series 2, image 13. White matter changes are seen in the right corona radiata on image 19. No acute cortical ischemia or cortical infarct identified. Ventricles and sulci are unremarkable. CT CERVICAL SPINE FINDINGS There is reversal of normal lordosis centered at C4. Minimal anterolisthesis of C7 versus T1, approximately 2 mm, is likely degenerative with no identified fracture in this location. The patient is status post fusion of the cervical spine from C3 through C5 with anterior plate and screws. Multilevel degenerative changes are seen with small posterior osteophytes and facet degenerative changes. Calcifications are seen in the bilateral carotid arteries. No soft tissue abnormalities otherwise seen. Mild narrowing of the cervical spine from C3 through C5 with an AP diameter/ dimension of 10 mm. IMPRESSION: 1. 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 No other acute abnormalities. 2. Degenerative and postoperative changes in the cervical spine with no fracture identified. Electronically Signed   By: Gerome Sam III M.D   On: 06/30/2015 21:02   Ct Cervical Spine Wo Contrast  06/30/2015  CLINICAL DATA:  Trauma. EXAM:  CT HEAD WITHOUT CONTRAST CT CERVICAL SPINE WITHOUT CONTRAST TECHNIQUE: Multidetector CT imaging of the head and cervical spine was performed following the standard protocol without intravenous contrast. Multiplanar CT image reconstructions of the cervical spine were also generated. COMPARISON:  None. FINDINGS: CT HEAD FINDINGS Paranasal sinuses, mastoid air cell, and bones are normal. Extracranial soft tissues are within normal limits. No subdural, epidural, or subarachnoid hemorrhage. No mass, mass effect, or midline shift. The cerebellum, brainstem, and basal cisterns are normal. Calcifications are seen in the basal ganglia. An age-indeterminate lacunar infarct is seen in the right caudate on series 2, image 13. White matter changes are seen in the right corona radiata on image 19. No acute cortical ischemia or cortical infarct identified. Ventricles and sulci are unremarkable. CT CERVICAL SPINE FINDINGS There is reversal of normal lordosis centered at C4. Minimal anterolisthesis of C7 versus T1, approximately 2 mm, is likely degenerative with no identified fracture in this location. The patient is status post fusion of the cervical spine from C3 through C5 with anterior plate and screws. Multilevel degenerative changes are seen with small posterior osteophytes and facet degenerative changes. Calcifications are seen in the bilateral carotid arteries. No soft tissue abnormalities otherwise seen. Mild narrowing of the cervical spine from C3 through C5 with an AP diameter/ dimension of 10 mm. IMPRESSION: 1. 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 No other acute abnormalities. 2. Degenerative and postoperative changes in the cervical spine with no fracture identified. Electronically Signed   By: Gerome Samavid  Williams III M.D   On: 06/30/2015 21:02   Dg Hand Complete Left  06/30/2015  CLINICAL DATA:  Pain after trauma the fifth finger. EXAM: LEFT HAND -  COMPLETE 3+ VIEW COMPARISON:  None. FINDINGS: Soft tissue swelling is seen in the fifth finger consistent with history. A high attenuation focus is seen along the volar aspect of the finger at the level of the proximal interphalangeal joint superficially. Deformity of the distal tuft of the fourth finger is probably chronic from previous trauma given history. No other acute fractures are seen. IMPRESSION: 1. Superficial round high-density focus in the soft tissues of the fifth finger at the level of the interphalangeal joint along the volar surface. This could represent a foreign body, depending on the location of the laceration. Recommend clinical correlation. 2. No acute fractures in the fifth finger. Deformity of the distal fourth tuft is probably chronic. Recommend clinical correlation. Electronically Signed   By: Gerome Samavid  Williams III M.D   On: 06/30/2015 19:50   I have personally reviewed and evaluated these images and lab results as part of my medical decision-making.   EKG Interpretation None      MDM  Patient seen and evaluated at bedside.  Patient initially intoxicated.  Given Boostrix for tetanus.  CT head and cervical spine without acute traumatic finding.  Hand xray concerning for foreign body.  Laceration already with granulation tissue it was irrigated copiously but unable to find foreign body on examination.  Since injury 584 days old recommended patient follow up with hand surgery and wound was dressed.  Patient was reevaluated after sleeping in the ER and was clinically sober at time of discharge.  He was discharged with prescription for keflex and referral to hand surgery.  Patient expressed understanding and agreement with plan of care. Final diagnoses:  Finger laceration, initial encounter  Alcohol intoxication, with unspecified complication (HCC)    1. Hand wound  2. Alcohol intoxication    Leta BaptistEmily Roe Nguyen, MD 07/01/15 [phone]

## 2015-07-01 NOTE — Discharge Instructions (Signed)
You were seen and evaluated today following her fall. Your finger laceration needs close follow-up with a hand surgeon. His information and referral have been provided. Take the antibiotics provided to protect from infection. Please refrain from heavy drinking of alcohol. Follow-up with your primary care physician for help with your heavy alcohol drinking.  Laceration Care, Adult A laceration is a cut that goes through all of the layers of the skin and into the tissue that is right under the skin. Some lacerations heal on their own. Others need to be closed with stitches (sutures), staples, skin adhesive strips, or skin glue. Proper laceration care minimizes the risk of infection and helps the laceration to heal better. HOW TO CARE FOR YOUR LACERATION If sutures or staples were used:  Keep the wound clean and dry.  If you were given a bandage (dressing), you should change it at least one time per day or as told by your health care provider. You should also change it if it becomes wet or dirty.  Keep the wound completely dry for the first 24 hours or as told by your health care provider. After that time, you may shower or bathe. However, make sure that the wound is not soaked in water until after the sutures or staples have been removed.  Clean the wound one time each day or as told by your health care provider:  Wash the wound with soap and water.  Rinse the wound with water to remove all soap.  Pat the wound dry with a clean towel. Do not rub the wound.  After cleaning the wound, apply a thin layer of antibiotic ointmentas told by your health care provider. This will help to prevent infection and keep the dressing from sticking to the wound.  Have the sutures or staples removed as told by your health care provider. If skin adhesive strips were used:  Keep the wound clean and dry.  If you were given a bandage (dressing), you should change it at least one time per day or as told by your  health care provider. You should also change it if it becomes dirty or wet.  Do not get the skin adhesive strips wet. You may shower or bathe, but be careful to keep the wound dry.  If the wound gets wet, pat it dry with a clean towel. Do not rub the wound.  Skin adhesive strips fall off on their own. You may trim the strips as the wound heals. Do not remove skin adhesive strips that are still stuck to the wound. They will fall off in time. If skin glue was used:  Try to keep the wound dry, but you may briefly wet it in the shower or bath. Do not soak the wound in water, such as by swimming.  After you have showered or bathed, gently pat the wound dry with a clean towel. Do not rub the wound.  Do not do any activities that will make you sweat heavily until the skin glue has fallen off on its own.  Do not apply liquid, cream, or ointment medicine to the wound while the skin glue is in place. Using those may loosen the film before the wound has healed.  If you were given a bandage (dressing), you should change it at least one time per day or as told by your health care provider. You should also change it if it becomes dirty or wet.  If a dressing is placed over the wound, be careful  not to apply tape directly over the skin glue. Doing that may cause the glue to be pulled off before the wound has healed.  Do not pick at the glue. The skin glue usually remains in place for 5-10 days, then it falls off of the skin. General Instructions  Take over-the-counter and prescription medicines only as told by your health care provider.  If you were prescribed an antibiotic medicine or ointment, take or apply it as told by your doctor. Do not stop using it even if your condition improves.  To help prevent scarring, make sure to cover your wound with sunscreen whenever you are outside after stitches are removed, after adhesive strips are removed, or when glue remains in place and the wound is healed.  Make sure to wear a sunscreen of at least 30 SPF.  Do not scratch or pick at the wound.  Keep all follow-up visits as told by your health care provider. This is important.  Check your wound every day for signs of infection. Watch for:  Redness, swelling, or pain.  Fluid, blood, or pus.  Raise (elevate) the injured area above the level of your heart while you are sitting or lying down, if possible. SEEK MEDICAL CARE IF:  You received a tetanus shot and you have swelling, severe pain, redness, or bleeding at the injection site.  You have a fever.  A wound that was closed breaks open.  You notice a bad smell coming from your wound or your dressing.  You notice something coming out of the wound, such as wood or glass.  Your pain is not controlled with medicine.  You have increased redness, swelling, or pain at the site of your wound.  You have fluid, blood, or pus coming from your wound.  You notice a change in the color of your skin near your wound.  You need to change the dressing frequently due to fluid, blood, or pus draining from the wound.  You develop a new rash.  You develop numbness around the wound. SEEK IMMEDIATE MEDICAL CARE IF:  You develop severe swelling around the wound.  Your pain suddenly increases and is severe.  You develop painful lumps near the wound or on skin that is anywhere on your body.  You have a red streak going away from your wound.  The wound is on your hand or foot and you cannot properly move a finger or toe.  The wound is on your hand or foot and you notice that your fingers or toes look pale or bluish.   This information is not intended to replace advice given to you by your health care provider. Make sure you discuss any questions you have with your health care provider.   Document Released: 12/23/2004 Document Revised: 05/09/2014 Document Reviewed: 12/19/2013 Elsevier Interactive Patient Education 2016 [employer].   Alcohol Intoxication Alcohol intoxication occurs when you drink enough alcohol that it affects your ability to function. It can be mild or very severe. Drinking a lot of alcohol in a short time is called binge drinking. This can be very harmful. Drinking alcohol can also be more dangerous if you are taking medicines or other drugs. Some of the effects caused by alcohol may include:  Loss of coordination.  Changes in mood and behavior.  Unclear thinking.  Trouble talking (slurred speech).  Throwing up (vomiting).  Confusion.  Slowed breathing.  Twitching and shaking (seizures).  Loss of consciousness. HOME CARE  Do not drive after drinking  alcohol.  Drink enough water and fluids to keep your pee (urine) clear or pale yellow. Avoid caffeine.  Only take medicine as told by your doctor. GET HELP IF:  You throw up (vomit) many times.  You do not feel better after a few days.  You frequently have alcohol intoxication. Your doctor can help decide if you should see a substance use treatment counselor. GET HELP RIGHT AWAY IF:  You become shaky when you stop drinking.  You have twitching and shaking.  You throw up blood. It may look bright red or like coffee grounds.  You notice blood in your poop (bowel movements).  You become lightheaded or pass out (faint). MAKE SURE YOU:   Understand these instructions.  Will watch your condition.  Will get help right away if you are not doing well or get worse.   This information is not intended to replace advice given to you by your health care provider. Make sure you discuss any questions you have with your health care provider.   Document Released: 06/11/2007 Document Revised: 08/25/2012 Document Reviewed: 05/28/2012 Elsevier Interactive Patient Education [employer]2016 [employer] Inc[employer]

## 2015-07-07 ENCOUNTER — Emergency Department (HOSPITAL_COMMUNITY): Payer: Medicaid Other

## 2015-07-07 ENCOUNTER — Encounter (HOSPITAL_COMMUNITY): Payer: Self-pay | Admitting: Emergency Medicine

## 2015-07-07 ENCOUNTER — Emergency Department (HOSPITAL_COMMUNITY)
Admission: EM | Admit: 2015-07-07 | Discharge: 2015-07-08 | Disposition: A | Discharge status: Home / Self Care | Payer: Medicaid Other | Attending: Emergency Medicine | Admitting: Emergency Medicine

## 2015-07-07 DIAGNOSIS — S42032A Displaced fracture of lateral end of left clavicle, initial encounter for closed fracture: Secondary | ICD-10-CM

## 2015-07-07 DIAGNOSIS — Y92410 Unspecified street and highway as the place of occurrence of the external cause: Secondary | ICD-10-CM | POA: Insufficient documentation

## 2015-07-07 DIAGNOSIS — W1839XA Other fall on same level, initial encounter: Secondary | ICD-10-CM | POA: Insufficient documentation

## 2015-07-07 DIAGNOSIS — Y999 Unspecified external cause status: Secondary | ICD-10-CM | POA: Insufficient documentation

## 2015-07-07 DIAGNOSIS — F17210 Nicotine dependence, cigarettes, uncomplicated: Secondary | ICD-10-CM | POA: Diagnosis not present

## 2015-07-07 DIAGNOSIS — F10120 Alcohol abuse with intoxication, uncomplicated: Secondary | ICD-10-CM | POA: Insufficient documentation

## 2015-07-07 DIAGNOSIS — Y9301 Activity, walking, marching and hiking: Secondary | ICD-10-CM | POA: Insufficient documentation

## 2015-07-07 DIAGNOSIS — F10920 Alcohol use, unspecified with intoxication, uncomplicated: Secondary | ICD-10-CM

## 2015-07-07 DIAGNOSIS — F1290 Cannabis use, unspecified, uncomplicated: Secondary | ICD-10-CM | POA: Diagnosis not present

## 2015-07-07 DIAGNOSIS — S42022A Displaced fracture of shaft of left clavicle, initial encounter for closed fracture: Secondary | ICD-10-CM | POA: Diagnosis not present

## 2015-07-07 DIAGNOSIS — M25512 Pain in left shoulder: Secondary | ICD-10-CM | POA: Diagnosis present

## 2015-07-07 LAB — CBC
HEMATOCRIT: 36.8 % — AB (ref 39.0–52.0)
HEMOGLOBIN: 12.2 g/dL — AB (ref 13.0–17.0)
MCH: 29.6 pg (ref 26.0–34.0)
MCHC: 33.2 g/dL (ref 30.0–36.0)
MCV: 89.3 fL (ref 78.0–100.0)
Platelets: 141 10*3/uL — ABNORMAL LOW (ref 150–400)
RBC: 4.12 MIL/uL — ABNORMAL LOW (ref 4.22–5.81)
RDW: 14.8 % (ref 11.5–15.5)
WBC: 3.7 10*3/uL — ABNORMAL LOW (ref 4.0–10.5)

## 2015-07-07 NOTE — ED Notes (Signed)
Bed: [id number] Expected date:  Expected time:  Means of arrival:  Comments: [age] M seizure/ETOH

## 2015-07-07 NOTE — ED Provider Notes (Signed)
CSN: [id number]     Arrival date & time 07/07/15  2234 History   First MD Initiated Contact with Patient 07/07/15 2236     Chief Complaint  Patient presents with  . Fall  . Shoulder Pain   HPI Patient presents to the emergency room with complaints of left shoulder pain. Patient states he was drinking a few beers tonight and smoked a blunt.  He started to walk across the street he began to feel dizzy and fell. Patient denies loss of consciousness or head injury. He is now having pain in his left shoulder. The pain increases with movement and palpation. He denies any other neck pain chest pain abdominal pain numbness or weakness. Past Medical History  Diagnosis Date  . Back pain    Past Surgical History  Procedure Laterality Date  . Anterior cervical decomp/discectomy fusion N/A 08/30/2013    Procedure: ANTERIOR CERVICAL DECOMPRESSION/DISCECTOMY FUSION 2 LEVELS;  Surgeon: Lisbeth RenshawNeelesh Nundkumar, MD;  Location: [facility];  Service: Neurosurgery;  Laterality: N/A;  C34 C45 anterior cervical decompression with fusion interbody prosthesis plating and bonegraft   Family History  Problem Relation Age of Onset  . CAD Mother   . CAD Father    Social History  Substance Use Topics  . Smoking status: Current Every Day Smoker -- 0.50 packs/day for 40 years    Types: Cigarettes  . Smokeless tobacco: Never Used  . Alcohol Use: Yes     Comment: Heavily    Review of Systems  All other systems reviewed and are negative.     Allergies  Review of patient's allergies indicates no known allergies.  Home Medications   Prior to Admission medications   Medication Sig Start Date End Date Taking? Authorizing Provider  cephALEXin (KEFLEX) 500 MG capsule Take 1 capsule (500 mg total) by mouth 4 (four) times daily. 07/01/15  Yes Leta BaptistEmily Roe Nguyen, MD   BP 131/89 mmHg  Pulse 64  Temp(Src) 97.9 F (36.6 C) (Oral)  Resp 18  SpO2 100% Physical Exam  Constitutional: He appears well-developed and  well-nourished. No distress.  HENT:  Head: Normocephalic and atraumatic.  Right Ear: External ear normal.  Left Ear: External ear normal.  Eyes: Conjunctivae are normal. Right eye exhibits no discharge. Left eye exhibits no discharge. No scleral icterus.  Neck: Neck supple. No tracheal deviation present.  Cardiovascular: Normal rate, regular rhythm and intact distal pulses.   Pulmonary/Chest: Effort normal and breath sounds normal. No stridor. No respiratory distress. He has no wheezes. He has no rales.  Abdominal: Soft. Bowel sounds are normal. He exhibits no distension. There is no tenderness. There is no rebound and no guarding.  Musculoskeletal: He exhibits no edema.       Left shoulder: He exhibits decreased range of motion, tenderness and bony tenderness. He exhibits no swelling, no effusion and no deformity.       Cervical back: Normal.       Thoracic back: Normal.       Lumbar back: Normal.  Neurological: He is alert. He has normal strength. No cranial nerve deficit (no facial droop, extraocular movements intact, no slurred speech) or sensory deficit. He exhibits normal muscle tone. He displays no seizure activity. Coordination normal.  Skin: Skin is warm and dry. No rash noted.  Psychiatric: He has a normal mood and affect.  Nursing note and vitals reviewed.   ED Course  Procedures  Medications given in the ED Medications  HYDROcodone-acetaminophen (NORCO/VICODIN) 5-325 MG per tablet 1  tablet (not administered)     Labs Review Labs Reviewed  CBC - Abnormal; Notable for the following:    WBC 3.7 (*)    RBC 4.12 (*)    Hemoglobin 12.2 (*)    HCT 36.8 (*)    Platelets 141 (*)    All other components within normal limits  ETHANOL - Abnormal; Notable for the following:    Alcohol, Ethyl (B) 195 (*)    All other components within normal limits  BASIC METABOLIC PANEL    Imaging Review Dg Shoulder Left  07/07/2015  CLINICAL DATA:  Left shoulder pain after falling today.  EXAM: LEFT SHOULDER - 2+ VIEW COMPARISON:  None. FINDINGS: There probably is a nondisplaced acute fracture of the distal left clavicle, seen only on one view. There is remote healed fracture deformity of the proximal left humerus. Moderate glenohumeral degenerative narrowing and sclerosis. No dislocation. IMPRESSION: Probable distal left clavicle fracture. Calcified granulomatous changes in the left upper lobe incidentally noted. Electronically Signed   By: Ellery Plunkaniel R Mitchell M.D.   On: 07/07/2015 23:52   I have personally reviewed and evaluated these images and lab results as part of my medical decision-making.   MDM   Final diagnoses:  Closed fracture of distal clavicle, left, initial encounter  Alcohol intoxication, uncomplicated (HCC)    Xray shows a distal clavicle fx.  This correlates with his pain.  Will dc home with a sling.  Pt counseled on alcohol use.  Pt monitored in the ED.  Able to walk without difficulty.  No slurred speech.  Clinically appears safe to go home.    Linwood DibblesJon Kiren Mcisaac, MD 07/08/15 [phone]

## 2015-07-07 NOTE — ED Notes (Signed)
Brought in by EMS from off a street with c/o left shoulder pain after fall.  Per EMS, pt reported that he drank beers tonight and got drunk.  Was walking drunk on the street and fell on the ground and landed on his left side.  Pt denies hitting head or loss of consciousness.  Pt has had immediate pain to left shoulder after fall.

## 2015-07-08 LAB — ETHANOL: ALCOHOL ETHYL (B): 195 mg/dL — AB (ref <5)

## 2015-07-08 LAB — BASIC METABOLIC PANEL
Anion gap: 7 (ref 5–15)
BUN: 14 mg/dL (ref 6–20)
CALCIUM: 9.3 mg/dL (ref 8.9–10.3)
CO2: 27 mmol/L (ref 22–32)
Chloride: 106 mmol/L (ref 101–111)
Creatinine, Ser: 0.82 mg/dL (ref 0.61–1.24)
GFR calc Af Amer: >60 mL/min (ref >60)
GLUCOSE: 98 mg/dL (ref 65–99)
POTASSIUM: 4.3 mmol/L (ref 3.5–5.1)
SODIUM: 140 mmol/L (ref 135–145)

## 2015-07-08 MED ORDER — HYDROCODONE-ACETAMINOPHEN 5-325 MG PO TABS
1.0000 | ORAL_TABLET | ORAL | Status: AC
  Administered 2015-07-08: 1 via ORAL
  Filled 2015-07-08: qty 1

## 2015-07-08 NOTE — Discharge Instructions (Signed)
Alcohol Intoxication Alcohol intoxication occurs when you drink enough alcohol that it affects your ability to function. It can be mild or very severe. Drinking a lot of alcohol in a short time is called binge drinking. This can be very harmful. Drinking alcohol can also be more dangerous if you are taking medicines or other drugs. Some of the effects caused by alcohol may include:  Loss of coordination.  Changes in mood and behavior.  Unclear thinking.  Trouble talking (slurred speech).  Throwing up (vomiting).  Confusion.  Slowed breathing.  Twitching and shaking (seizures).  Loss of consciousness. HOME CARE  Do not drive after drinking alcohol.  Drink enough water and fluids to keep your pee (urine) clear or pale yellow. Avoid caffeine.  Only take medicine as told by your doctor. GET HELP IF:  You throw up (vomit) many times.  You do not feel better after a few days.  You frequently have alcohol intoxication. Your doctor can help decide if you should see a substance use treatment counselor. GET HELP RIGHT AWAY IF:  You become shaky when you stop drinking.  You have twitching and shaking.  You throw up blood. It may look bright red or like coffee grounds.  You notice blood in your poop (bowel movements).  You become lightheaded or pass out (faint). MAKE SURE YOU:   Understand these instructions.  Will watch your condition.  Will get help right away if you are not doing well or get worse.   This information is not intended to replace advice given to you by your health care provider. Make sure you discuss any questions you have with your health care provider.   Document Released: 06/11/2007 Document Revised: 08/25/2012 Document Reviewed: 05/28/2012 Elsevier Interactive Patient Education 2016 Else[employer] Clavicle Fracture The clavicle, also called the collarbone, is the long bone that connects your shoulder to your rib cage. You can feel your collarbone at  the top of your shoulders and rib cage. A clavicle fracture is a broken clavicle. It is a common injury that can happen at any age.  CAUSES Common causes of a clavicle fracture include:  A direct blow to your shoulder.  A car accident.  A fall, especially if you try to break your fall with an outstretched arm. RISK FACTORS You may be at increased risk if:  You are younger than 25 years or older than 75 years. Most clavicle fractures happen to people who are younger than 25 years.  You are a male.  You play contact sports. SIGNS AND SYMPTOMS A fractured clavicle is painful. It also makes it hard to move your arm. Other signs and symptoms may include:  A shoulder that drops downward and forward.  Pain when trying to lift your shoulder.  Bruising, swelling, and tenderness over your clavicle.  A grinding noise when you try to move your shoulder.  A bump over your clavicle. DIAGNOSIS Your health care provider can usually diagnose a clavicle fracture by asking about your injury and examining your shoulder and clavicle. He or she may take an X-ray to determine the position of your clavicle. TREATMENT Treatment depends on the position of your clavicle after the fracture:  If the broken ends of the bone are not out of place, your health care provider may put your arm in a sling or wrap a support bandage around your chest (figure-of-eight wrap).  If the broken ends of the bone are out of place, you may need surgery. Surgery may involve  placing screws, pins, or plates to keep your clavicle stable while it heals. Healing may take about 3 months. When your health care provider thinks your fracture has healed enough, you may have to do physical therapy to regain normal movement and build up your arm strength. HOME CARE INSTRUCTIONS   Apply ice to the injured area:  Put ice in a plastic bag.  Place a towel between your skin and the bag.  Leave the ice on for 20 minutes, 2-3 times a  day.  If you have a wrap or splint:  Wear it all the time, and remove it only to take a bath or shower.  When you bathe or shower, keep your shoulder in the same position as when the sling or wrap is on.  Do not lift your arm.  If you have a figure-of-eight wrap:  Another person must tighten it every day.  It should be tight enough to hold your shoulders back.  Allow enough room to place your index finger between your body and the strap.  Loosen the wrap immediately if you feel numbness or tingling in your hands.  Only take medicines as directed by your health care provider.  Avoid activities that make the injury or pain worse for 4-6 weeks after surgery.  Keep all follow-up appointments. SEEK MEDICAL CARE IF:  Your medicine is not helping to relieve pain and swelling. SEEK IMMEDIATE MEDICAL CARE IF:  Your arm is numb, cold, or pale, even when the splint is loose. MAKE SURE YOU:   Understand these instructions.  Will watch your condition.  Will get help right away if you are not doing well or get worse.   This information is not intended to replace advice given to you by your health care provider. Make sure you discuss any questions you have with your health care provider.   Document Released: 10/02/2004 Document Revised: 12/28/2012 Document Reviewed: 11/15/2012 Elsevier Interactive Patient Education [employer]2016 Elsevier Inc[employer]

## 2015-08-01 ENCOUNTER — Emergency Department (HOSPITAL_COMMUNITY): Payer: Medicaid Other

## 2015-08-01 ENCOUNTER — Encounter (HOSPITAL_COMMUNITY): Payer: Self-pay | Admitting: *Deleted

## 2015-08-01 ENCOUNTER — Inpatient Hospital Stay (HOSPITAL_COMMUNITY)
Admission: EM | Admit: 2015-08-01 | Discharge: 2015-08-03 | DRG: 312 | Disposition: A | Discharge status: Home / Self Care | Payer: Medicaid Other | Attending: Internal Medicine | Admitting: Internal Medicine

## 2015-08-01 DIAGNOSIS — W19XXXD Unspecified fall, subsequent encounter: Secondary | ICD-10-CM | POA: Diagnosis present

## 2015-08-01 DIAGNOSIS — N390 Urinary tract infection, site not specified: Secondary | ICD-10-CM | POA: Diagnosis not present

## 2015-08-01 DIAGNOSIS — Z590 Homelessness: Secondary | ICD-10-CM

## 2015-08-01 DIAGNOSIS — F17210 Nicotine dependence, cigarettes, uncomplicated: Secondary | ICD-10-CM | POA: Diagnosis present

## 2015-08-01 DIAGNOSIS — Z8673 Personal history of transient ischemic attack (TIA), and cerebral infarction without residual deficits: Secondary | ICD-10-CM

## 2015-08-01 DIAGNOSIS — S42002D Fracture of unspecified part of left clavicle, subsequent encounter for fracture with routine healing: Secondary | ICD-10-CM

## 2015-08-01 DIAGNOSIS — L8990 Pressure ulcer of unspecified site, unspecified stage: Secondary | ICD-10-CM | POA: Insufficient documentation

## 2015-08-01 DIAGNOSIS — D696 Thrombocytopenia, unspecified: Secondary | ICD-10-CM

## 2015-08-01 DIAGNOSIS — Z79899 Other long term (current) drug therapy: Secondary | ICD-10-CM

## 2015-08-01 DIAGNOSIS — I951 Orthostatic hypotension: Principal | ICD-10-CM | POA: Diagnosis present

## 2015-08-01 DIAGNOSIS — R55 Syncope and collapse: Secondary | ICD-10-CM | POA: Diagnosis not present

## 2015-08-01 DIAGNOSIS — E860 Dehydration: Secondary | ICD-10-CM | POA: Diagnosis not present

## 2015-08-01 DIAGNOSIS — Z8249 Family history of ischemic heart disease and other diseases of the circulatory system: Secondary | ICD-10-CM

## 2015-08-01 DIAGNOSIS — Z66 Do not resuscitate: Secondary | ICD-10-CM | POA: Diagnosis present

## 2015-08-01 DIAGNOSIS — M25551 Pain in right hip: Secondary | ICD-10-CM

## 2015-08-01 DIAGNOSIS — F1010 Alcohol abuse, uncomplicated: Secondary | ICD-10-CM

## 2015-08-01 DIAGNOSIS — F17200 Nicotine dependence, unspecified, uncomplicated: Secondary | ICD-10-CM | POA: Diagnosis present

## 2015-08-01 LAB — URINE MICROSCOPIC-ADD ON: RBC / HPF: NONE SEEN RBC/hpf (ref 0–5)

## 2015-08-01 LAB — URINALYSIS, ROUTINE W REFLEX MICROSCOPIC
Glucose, UA: NEGATIVE mg/dL
Hgb urine dipstick: NEGATIVE
KETONES UR: 40 mg/dL — AB
NITRITE: NEGATIVE
PH: 5.5 (ref 5.0–8.0)
Protein, ur: 30 mg/dL — AB
Specific Gravity, Urine: 1.028 (ref 1.005–1.030)

## 2015-08-01 LAB — COMPREHENSIVE METABOLIC PANEL
ALBUMIN: 4.6 g/dL (ref 3.5–5.0)
ALK PHOS: 84 U/L (ref 38–126)
ALT: 11 U/L — AB (ref 17–63)
AST: 28 U/L (ref 15–41)
Anion gap: 16 — ABNORMAL HIGH (ref 5–15)
BUN: 33 mg/dL — AB (ref 6–20)
CHLORIDE: 103 mmol/L (ref 101–111)
CO2: 23 mmol/L (ref 22–32)
CREATININE: 1.55 mg/dL — AB (ref 0.61–1.24)
Calcium: 9.7 mg/dL (ref 8.9–10.3)
GFR calc Af Amer: 53 mL/min — ABNORMAL LOW (ref >60)
GFR calc non Af Amer: 46 mL/min — ABNORMAL LOW (ref >60)
Glucose, Bld: 75 mg/dL (ref 65–99)
POTASSIUM: 4.6 mmol/L (ref 3.5–5.1)
SODIUM: 142 mmol/L (ref 135–145)
Total Bilirubin: 1.5 mg/dL — ABNORMAL HIGH (ref 0.3–1.2)
Total Protein: 8.2 g/dL — ABNORMAL HIGH (ref 6.5–8.1)

## 2015-08-01 LAB — CBC WITH DIFFERENTIAL/PLATELET
BASOS ABS: 0 10*3/uL (ref 0.0–0.1)
BASOS PCT: 0 %
EOS ABS: 0 10*3/uL (ref 0.0–0.7)
Eosinophils Relative: 1 %
HCT: 40.3 % (ref 39.0–52.0)
HEMOGLOBIN: 13.2 g/dL (ref 13.0–17.0)
Lymphocytes Relative: 34 %
Lymphs Abs: 1.4 10*3/uL (ref 0.7–4.0)
MCH: 29.3 pg (ref 26.0–34.0)
MCHC: 32.8 g/dL (ref 30.0–36.0)
MCV: 89.6 fL (ref 78.0–100.0)
Monocytes Absolute: 0.3 10*3/uL (ref 0.1–1.0)
Monocytes Relative: 8 %
NEUTROS PCT: 57 %
Neutro Abs: 2.4 10*3/uL (ref 1.7–7.7)
Platelets: 78 10*3/uL — ABNORMAL LOW (ref 150–400)
RBC: 4.5 MIL/uL (ref 4.22–5.81)
RDW: 14.3 % (ref 11.5–15.5)
WBC: 4.1 10*3/uL (ref 4.0–10.5)

## 2015-08-01 LAB — RAPID URINE DRUG SCREEN, HOSP PERFORMED
Amphetamines: NOT DETECTED
BENZODIAZEPINES: POSITIVE — AB
Barbiturates: NOT DETECTED
COCAINE: NOT DETECTED
OPIATES: NOT DETECTED
Tetrahydrocannabinol: NOT DETECTED

## 2015-08-01 LAB — I-STAT TROPONIN, ED: TROPONIN I, POC: 0.01 ng/mL (ref 0.00–0.08)

## 2015-08-01 LAB — ETHANOL

## 2015-08-01 MED ORDER — LORAZEPAM 1 MG PO TABS
1.0000 mg | ORAL_TABLET | Freq: Four times a day (QID) | ORAL | Status: DC | PRN
  Administered 2015-08-01 – 2015-08-02 (×2): 1 mg via ORAL
  Filled 2015-08-01 (×2): qty 1

## 2015-08-01 MED ORDER — SODIUM CHLORIDE 0.9 % IV SOLN
INTRAVENOUS | Status: DC
  Administered 2015-08-01 – 2015-08-03 (×5): via INTRAVENOUS

## 2015-08-01 MED ORDER — SODIUM CHLORIDE 0.9 % IV BOLUS (SEPSIS)
1000.0000 mL | Freq: Once | INTRAVENOUS | Status: AC
  Administered 2015-08-01: 1000 mL via INTRAVENOUS

## 2015-08-01 MED ORDER — ONDANSETRON HCL 4 MG/2ML IJ SOLN: 4.0000 mg | Freq: Three times a day (TID) | INTRAMUSCULAR | Status: AC | PRN

## 2015-08-01 MED ORDER — SODIUM CHLORIDE 0.9 % IV SOLN: Freq: Once | INTRAVENOUS | Status: DC

## 2015-08-01 MED ORDER — THIAMINE HCL 100 MG/ML IJ SOLN: 100.0000 mg | Freq: Every day | INTRAMUSCULAR | Status: DC

## 2015-08-01 MED ORDER — DEXTROSE 5 % IV SOLN
1.0000 g | INTRAVENOUS | Status: DC
  Administered 2015-08-02: 1 g via INTRAVENOUS
  Filled 2015-08-01 (×2): qty 10

## 2015-08-01 MED ORDER — CEFTRIAXONE SODIUM 1 G IJ SOLR
1.0000 g | Freq: Once | INTRAMUSCULAR | Status: AC
  Administered 2015-08-01: 1 g via INTRAVENOUS
  Filled 2015-08-01: qty 10

## 2015-08-01 MED ORDER — ADULT MULTIVITAMIN W/MINERALS CH
1.0000 | ORAL_TABLET | Freq: Every day | ORAL | Status: DC
  Administered 2015-08-01 – 2015-08-03 (×3): 1 via ORAL
  Filled 2015-08-01 (×3): qty 1

## 2015-08-01 MED ORDER — FOLIC ACID 1 MG PO TABS
1.0000 mg | ORAL_TABLET | Freq: Every day | ORAL | Status: DC
  Administered 2015-08-01 – 2015-08-03 (×3): 1 mg via ORAL
  Filled 2015-08-01 (×3): qty 1

## 2015-08-01 MED ORDER — ACETAMINOPHEN 650 MG RE SUPP
650.0000 mg | Freq: Four times a day (QID) | RECTAL | Status: DC | PRN
Start: 2015-08-01 — End: 2015-08-03

## 2015-08-01 MED ORDER — ENSURE ENLIVE PO LIQD
237.0000 mL | Freq: Two times a day (BID) | ORAL | Status: DC
  Administered 2015-08-02 – 2015-08-03 (×3): 237 mL via ORAL

## 2015-08-01 MED ORDER — ONDANSETRON HCL 4 MG/2ML IJ SOLN
4.0000 mg | Freq: Four times a day (QID) | INTRAMUSCULAR | Status: DC | PRN
Start: 2015-08-01 — End: 2015-08-01

## 2015-08-01 MED ORDER — ONDANSETRON HCL 4 MG PO TABS: 4.0000 mg | ORAL_TABLET | Freq: Four times a day (QID) | ORAL | Status: DC | PRN

## 2015-08-01 MED ORDER — LORAZEPAM 2 MG/ML IJ SOLN: 1.0000 mg | Freq: Four times a day (QID) | INTRAMUSCULAR | Status: DC | PRN

## 2015-08-01 MED ORDER — ACETAMINOPHEN 325 MG PO TABS
650.0000 mg | ORAL_TABLET | Freq: Four times a day (QID) | ORAL | Status: DC | PRN
Start: 2015-08-01 — End: 2015-08-03
  Administered 2015-08-02: 650 mg via ORAL
  Filled 2015-08-01: qty 2

## 2015-08-01 MED ORDER — HEPARIN SODIUM (PORCINE) 5000 UNIT/ML IJ SOLN: 5000.0000 [IU] | Freq: Three times a day (TID) | INTRAMUSCULAR | Status: DC

## 2015-08-01 MED ORDER — SODIUM CHLORIDE 0.9 % IV SOLN: INTRAVENOUS | Status: AC

## 2015-08-01 MED ORDER — NICOTINE 14 MG/24HR TD PT24
14.0000 mg | MEDICATED_PATCH | Freq: Every day | TRANSDERMAL | Status: DC
  Administered 2015-08-01 – 2015-08-03 (×3): 14 mg via TRANSDERMAL
  Filled 2015-08-01 (×3): qty 1

## 2015-08-01 MED ORDER — VITAMIN B-1 100 MG PO TABS
100.0000 mg | ORAL_TABLET | Freq: Every day | ORAL | Status: DC
  Administered 2015-08-01 – 2015-08-03 (×3): 100 mg via ORAL
  Filled 2015-08-01 (×3): qty 1

## 2015-08-01 NOTE — H&P (Addendum)
History and Physical    Albert Bond QIO:[id number] DOB: 08/14/1950 DOA: 08/01/2015    PCP: Doris Cheadle, MD  Patient coming from: the street- homeless  Chief Complaint: passed out  HPI: Albert Bond is a 65 y.o. 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 Not on any medications. He is homeless and felt dizzy when walking down a hill and subsequently passed out. EMS was called by a passerby. No seizure activity. No loss of control of bowel and bladder He was in the ER on 7/1 for a fall and found to have a left clavicle fracture. He has been having occasional pain in this area.  \   ED Course: BUN/ Cr elevated: 33/ 1.55, T bili 1.5, Platelets 78, UTI, Troponin and EKG normal, orthostatics ++, Benzodiazepine + UDS  Review of Systems:  Coughing up more phlegm than usual- it is clear.  Has been making less urine than normal- no dysuria or supapubic pain All other systems reviewed and apart from HPI, are negative.  Past Medical History:  Diagnosis Date  . Back pain     Past Surgical History:  Procedure Laterality Date  . ANTERIOR CERVICAL DECOMP/DISCECTOMY FUSION N/A 08/30/2013   Procedure: ANTERIOR CERVICAL DECOMPRESSION/DISCECTOMY FUSION 2 LEVELS;  Surgeon: Lisbeth Renshaw, MD;  Location: [facility];  Service: Neurosurgery;  Laterality: N/A;  C34 C45 anterior cervical decompression with fusion interbody prosthesis plating and bonegraft    Social History:  Smokes 1ppd, drinks alcohol occasionally- not daily, smokes marijauna but states he uses no other drugs- has been homeless for a few months- previously was living with a cousin who kicked him out  No Known Allergies  Family History  Problem Relation Age of Onset  . CAD Mother   . CAD Father      Prior to Admission medications   Medication Sig Start Date End Date Taking? Authorizing Provider  cephALEXin (KEFLEX) 500 MG capsule Take 1  capsule (500 mg total) by mouth 4 (four) times daily. Patient not taking: Reported on 08/01/2015 07/01/15   Leta Baptist, MD    Physical Exam: Vitals:   08/01/15 1346  BP: 112/83  Pulse: 94  Resp: 18  Temp: 98.3 F (36.8 C)  TempSrc: Oral  SpO2: 100%      Constitutional: NAD, calm, comfortable Eyes: PERTLA, lids and conjunctivae normal ENMT: Mucous membranes are moist. Posterior pharynx clear of any exudate or lesions. Normal dentition.  Neck: normal, supple, no masses, no thyromegaly Respiratory: clear to auscultation bilaterally, no wheezing, no crackles. Normal respiratory effort. No accessory muscle use.  Cardiovascular: S1 & S2 heard, regular rate and rhythm, no murmurs / rubs / gallops. No extremity edema. 2+ pedal pulses. No carotid bruits.  Abdomen: No distension, no tenderness, no masses palpated. No hepatosplenomegaly. Bowel sounds normal.  Musculoskeletal: no clubbing / cyanosis. No joint deformity upper and lower extremities. Good ROM, no contractures. Normal muscle tone.  Skin: no rashes, lesions, ulcers. No induration Neurologic: CN 2-12 grossly intact. Sensation intact, DTR normal. Strength 5/5 in all 4 limbs.  Psychiatric: Normal judgment and insight. Alert and oriented x 3. Normal mood.     Labs on Admission: I have personally reviewed following labs and imaging studies  CBC:  Recent Labs Lab 08/01/15 1504  WBC 4.1  NEUTROABS 2.4  HGB 13.2  HCT 40.3  MCV 89.6  PLT 78*   Basic Metabolic Panel:  Recent Labs Lab 08/01/15 1504  NA 142  K 4.6  CL 103  CO2 23  GLUCOSE 75  BUN 33*  CREATININE 1.55*  CALCIUM 9.7   GFR: CrCl cannot be calculated (Unknown ideal weight.). Liver Function Tests:  Recent Labs Lab 08/01/15 1504  AST 28  ALT 11*  ALKPHOS 84  BILITOT 1.5*  PROT 8.2*  ALBUMIN 4.6   No results for input(s): LIPASE, AMYLASE in the last 168 hours. No results for input(s): AMMONIA in the last 168 hours. Coagulation Profile: No  results for input(s): INR, PROTIME in the last 168 hours. Cardiac Enzymes: No results for input(s): CKTOTAL, CKMB, CKMBINDEX, TROPONINI in the last 168 hours. BNP (last 3 results) No results for input(s): PROBNP in the last 8760 hours. HbA1C: No results for input(s): HGBA1C in the last 72 hours. CBG: No results for input(s): GLUCAP in the last 168 hours. Lipid Profile: No results for input(s): CHOL, HDL, LDLCALC, TRIG, CHOLHDL, LDLDIRECT in the last 72 hours. Thyroid Function Tests: No results for input(s): TSH, T4TOTAL, FREET4, T3FREE, THYROIDAB in the last 72 hours. Anemia Panel: No results for input(s): VITAMINB12, FOLATE, FERRITIN, TIBC, IRON, RETICCTPCT in the last 72 hours. Urine analysis:    Component Value Date/Time   COLORURINE AMBER (A) 08/01/2015 1600   APPEARANCEUR CLOUDY (A) 08/01/2015 1600   LABSPEC 1.028 08/01/2015 1600   PHURINE 5.5 08/01/2015 1600   GLUCOSEU NEGATIVE 08/01/2015 1600   HGBUR NEGATIVE 08/01/2015 1600   BILIRUBINUR LARGE (A) 08/01/2015 1600   KETONESUR 40 (A) 08/01/2015 1600   PROTEINUR 30 (A) 08/01/2015 1600   UROBILINOGEN 0.2 09/01/2013 1404   NITRITE NEGATIVE 08/01/2015 1600   LEUKOCYTESUR MODERATE (A) 08/01/2015 1600   Sepsis Labs: @LABRCNTIP (procalcitonin:4,lacticidven:4) )No results found for this or any previous visit (from the past 240 hour(s)).   Radiological Exams on Admission: Ct Head Wo Contrast  Result Date: 08/01/2015 CLINICAL DATA:  Status post fall 9 days ago. The patient reports difficulty walking since the fall. Initial encounter. EXAM: CT HEAD WITHOUT CONTRAST CT CERVICAL SPINE WITHOUT CONTRAST TECHNIQUE: Multidetector CT imaging of the head and cervical spine was performed following the standard protocol without intravenous contrast. Multiplanar CT image reconstructions of the cervical spine were also generated. COMPARISON:  Head and cervical spine CT scans 06/30/2015. FINDINGS: CT HEAD FINDINGS There is cortical atrophy and  chronic microvascular ischemic change. Remote lacunar infarction right caudate head is unchanged. No evidence of acute intracranial abnormality including hemorrhage, infarct, mass lesion, mass effect, midline shift or abnormal extra-axial fluid collection is identified. No hydrocephalus or pneumocephalus. The calvarium is intact. Carotid atherosclerosis is noted. Imaged paranasal sinuses and mastoid air cells are clear. CT CERVICAL SPINE FINDINGS No fracture or malalignment is identified. The patient is status post C3-6 fusion. Hardware in C6 has been removed. No bridging bone is seen across the C3-4 disc interspace consistent with pseudoarthrosis. Lung apices are clear. IMPRESSION: No acute abnormality head or cervical spine. Atrophy and chronic microvascular ischemic change. 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 Electronically Signed   By: Drusilla Kanner M.D.   On: 08/01/2015 15:22  Ct Cervical Spine Wo Contrast  Result Date: 08/01/2015 CLINICAL DATA:  Status post fall 9 days ago. The patient reports difficulty walking since the fall. Initial encounter. EXAM: CT HEAD WITHOUT CONTRAST CT CERVICAL SPINE WITHOUT CONTRAST TECHNIQUE: Multidetector CT imaging of the head and cervical spine was performed following the standard protocol without intravenous contrast. Multiplanar CT image reconstructions of the cervical spine were also generated. COMPARISON:  Head and cervical spine CT scans 06/30/2015. FINDINGS: CT HEAD FINDINGS There is cortical atrophy and chronic microvascular ischemic change. Remote lacunar infarction right caudate head is unchanged. No evidence of acute intracranial abnormality including hemorrhage, infarct, mass lesion, mass effect, midline shift or abnormal extra-axial fluid collection is identified. No hydrocephalus or pneumocephalus. The calvarium is intact. Carotid atherosclerosis is noted. Imaged paranasal sinuses and mastoid air cells are clear. CT  CERVICAL SPINE FINDINGS No fracture or malalignment is identified. The patient is status post C3-6 fusion. Hardware in C6 has been removed. No bridging bone is seen across the C3-4 disc interspace consistent with pseudoarthrosis. Lung apices are clear. IMPRESSION: No acute abnormality head or cervical spine. Atrophy and chronic microvascular ischemic change. 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 Electronically Signed   By: Drusilla Kanner M.D.   On: 08/01/2015 15:22  Ct Hip Right Wo Contrast  Result Date: 08/01/2015 CLINICAL DATA:  Larey Seat 9 days ago.  Persistent right hip pain. EXAM: CT OF THE RIGHT HIP WITHOUT CONTRAST TECHNIQUE: Multidetector CT imaging of the right hip was performed according to the standard protocol. Multiplanar CT image reconstructions were also generated. COMPARISON:  Radiograph 08/01/2015 FINDINGS: Advanced right hip joint degenerative changes with joint space narrowing, osteophytic spurring and subchondral cystic change. Os acetabuli noted. No acute hip fracture is identified. No evidence of AVN. The visualized right hemipelvis is intact. No definite pubic rami or lower cervical fracture. Osteochondroma versus remote avulsion fracture at the anterior superior iliac spine. Advanced vascular calcifications for age. IMPRESSION: 1. No acute fracture. 2. Moderate to advanced right hip joint degenerative changes for age. 3. Advanced atherosclerotic calcifications for age. Electronically Signed   By: Rudie Meyer M.D.   On: 08/01/2015 16:25  Dg Shoulder Left  Result Date: 08/01/2015 CLINICAL DATA:  Status post fall 11 days ago.  Left shoulder pain. EXAM: LEFT SHOULDER - 2+ VIEW COMPARISON:  Plain films left shoulder 07/07/2015. FINDINGS: Subacute fracture of the distal left clavicle is seen as on the prior exam. There has been some bony resorption about fracture margins since the prior examination. No new fracture is identified. The acromioclavicular joint  is intact. The shoulder is located. Calcified granulomata within the left lung are noted. IMPRESSION: No acute abnormality. Subacute fracture of the distal left clavicle as seen on the prior exam. Electronically Signed   By: Drusilla Kanner M.D.   On: 08/01/2015 15:11  Dg Hip Unilat With Pelvis 2-3 Views Right  Result Date: 08/01/2015 CLINICAL DATA:  Fall 10 days ago with right hip pain EXAM: DG HIP (WITH OR WITHOUT PELVIS) 2-3V RIGHT COMPARISON:  None. FINDINGS: Frontal pelvis shows no fracture. SI joints and symphysis pubis are unremarkable. No inferior or superior pubic ramus fracture evident. AP and frog-leg lateral views of the right hip show no definite fracture although assessment is limited by positioning. IMPRESSION: No definite right femoral neck fracture although study limited by patient positioning. CT or MRI may prove helpful to further evaluate if there is high clinical suspicion for femoral neck fracture. Electronically Signed   By: Kennith Center M.D.   On: 08/01/2015 15:06   EKG: Independently reviewed.NSR  Assessment/Plan Active Problems:   Syncope - likely orthostatic hypotension - hydrate and follow orthostatic - follow in telemetry    Dehydration - cont IVF    UTI (lower urinary tract infection) - Rocephin - follow up on post void residual    Tobacco use disorder - nicotine patch  Alcohol abuse - CIWA scale although he states he drinks only occasionally     Thrombocytopenia / elevated Bili - possibly due to above- follow  Benzodiazepines on UDS - patient states he does not use these- recheck UDS  Homeless - will request case management consult   DVT prophylaxis: Heparin  Code Status: DNR  Family Communication:   Disposition Plan: follow for 1-2 days  Consults called:   Admission status: observation    [facility] MD [facility] Pager: [email or web address] Password [email or web address] 7PM-7AM, please contact night-coverage   08/01/2015, 5:21 PM

## 2015-08-01 NOTE — ED Triage Notes (Signed)
Per [facility], pt complains of pain in his left shoulder and right hip since falling 9 days ago. Pt is homeless, states he has not eaten since Friday. Pt states he is having difficulty walking since the fall.

## 2015-08-01 NOTE — ED Provider Notes (Signed)
[facility]-EMERGENCY DEPT Provider Note   CSN: [id number] Arrival date & time: 08/01/15  1328  First Provider Contact:  None       History   Chief Complaint Chief Complaint  Patient presents with  . Fall  . Hip Pain  . Shoulder Pain    HPI Albert Bond is a 65 y.o. male.  Albert Bond is a 65 y.o. male presents to emergency department complaining of near syncopal episode and paid to the right hip and left shoulder. Patient is a poor historian. He told the RN that he fell 9 days ago and has had trouble walking since. He told me that he has had 3 falls. He had a fall approximately 3 weeks ago when he injured left shoulder. He was seen here and diagnosed with distal clavicle fracture. He states he fell again on July 17. He states that he hit the back of his head. Was not seen anywhere at that time because he said that "I was getting out of jail and they would not bring me here." Patient apparently fell again today. He states he was walking down a hill and began feeling dizzy. He states next thing he remembers is falling to the ground. Injured right hip. He told me he was unable to get up or walk. EMS picked him up and brought him here. He currently denies any chest pain, no shortness of breath, no palpitations. Denies headache. Unsure if lost consciousness. He states that he has not eaten in 6 days.       Past Medical History:  Diagnosis Date  . Back pain     Patient Active Problem List   Diagnosis Date Noted  . Tobacco use disorder 09/12/2013  . Left-sided weakness 08/24/2013  . Syncope 08/24/2013  . Anemia 08/24/2013    Past Surgical History:  Procedure Laterality Date  . ANTERIOR CERVICAL DECOMP/DISCECTOMY FUSION N/A 08/30/2013   Procedure: ANTERIOR CERVICAL DECOMPRESSION/DISCECTOMY FUSION 2 LEVELS;  Surgeon: Lisbeth Renshaw, MD;  Location: [facility];  Service: Neurosurgery;  Laterality: N/A;  C34 C45 anterior cervical decompression with fusion interbody  prosthesis plating and bonegraft       Home Medications    Prior to Admission medications   Medication Sig Start Date End Date Taking? Authorizing Provider  cephALEXin (KEFLEX) 500 MG capsule Take 1 capsule (500 mg total) by mouth 4 (four) times daily. 07/01/15   Leta Baptist, MD    Family History Family History  Problem Relation Age of Onset  . CAD Mother   . CAD Father     Social History Social History  Substance Use Topics  . Smoking status: Current Every Day Smoker    Packs/day: 0.50    Years: 40.00    Types: Cigarettes  . Smokeless tobacco: Never Used  . Alcohol use Yes     Comment: Heavily     Allergies   Review of patient's allergies indicates no known allergies.   Review of Systems Review of Systems  Constitutional: Negative for chills and fever.  Respiratory: Negative for cough, chest tightness and shortness of breath.   Cardiovascular: Negative for chest pain, palpitations and leg swelling.  Gastrointestinal: Negative for abdominal distention, abdominal pain, diarrhea, nausea and vomiting.  Genitourinary: Negative for dysuria, frequency, hematuria and urgency.  Musculoskeletal: Positive for arthralgias and joint swelling. Negative for myalgias, neck pain and neck stiffness.  Skin: Negative for rash.  Allergic/Immunologic: Negative for immunocompromised state.  Neurological: Positive for dizziness and light-headedness. Negative for  weakness, numbness and headaches.  All other systems reviewed and are negative.    Physical Exam Updated Vital Signs BP 112/83 (BP Location: Right Arm)   Pulse 94   Temp 98.3 F (36.8 C) (Oral)   Resp 18   SpO2 100%   Physical Exam  Constitutional: He is oriented to person, place, and time. He appears well-developed and well-nourished. No distress.  HENT:  Head: Normocephalic and atraumatic.  Eyes: Conjunctivae are normal.  Neck: Neck supple.  Cardiovascular: Normal rate, regular rhythm and normal heart sounds.    No murmur heard. Pulmonary/Chest: Effort normal. No respiratory distress. He has no wheezes. He has no rales.  Abdominal: Soft. Bowel sounds are normal. He exhibits no distension. There is no tenderness. There is no rebound.  Musculoskeletal: He exhibits no edema.  Tender to palpation over right lateral hip. Pain with any range of motion of the hip joint. Distal dorsal pedal pulses intact and equal bilaterally. Tenderness palpation over distal left clavicle. Full range of motion of the left shoulder with pain. Distal radial pulses intact and equal bilaterally.  Neurological: He is alert and oriented to person, place, and time.  Skin: Skin is warm and dry.  Nursing note and vitals reviewed.    ED Treatments / Results  Labs (all labs ordered are listed, but only abnormal results are displayed) Labs Reviewed  CBC WITH DIFFERENTIAL/PLATELET  COMPREHENSIVE METABOLIC PANEL  ETHANOL  I-STAT TROPOININ, ED    EKG  EKG Interpretation  Date/Time:  Wednesday August 01 2015 14:32:12 EDT Ventricular Rate:  84 PR Interval:    QRS Duration: 101 QT Interval:  377 QTC Calculation: 446 R Axis:   78 Text Interpretation:  Sinus rhythm No significant change since last tracing Confirmed by Kandis Mannan ([id number]) on 08/01/2015 2:37:16 PM       Radiology Ct Head Wo Contrast  Result Date: 08/01/2015 CLINICAL DATA:  Status post fall 9 days ago. The patient reports difficulty walking since the fall. Initial encounter. EXAM: CT HEAD WITHOUT CONTRAST CT CERVICAL SPINE WITHOUT CONTRAST TECHNIQUE: Multidetector CT imaging of the head and cervical spine was performed following the standard protocol without intravenous contrast. Multiplanar CT image reconstructions of the cervical spine were also generated. COMPARISON:  Head and cervical spine CT scans 06/30/2015. FINDINGS: CT HEAD FINDINGS There is cortical atrophy and chronic microvascular ischemic change. Remote lacunar infarction right caudate head is  unchanged. No evidence of acute intracranial abnormality including hemorrhage, infarct, mass lesion, mass effect, midline shift or abnormal extra-axial fluid collection is identified. No hydrocephalus or pneumocephalus. The calvarium is intact. Carotid atherosclerosis is noted. Imaged paranasal sinuses and mastoid air cells are clear. CT CERVICAL SPINE FINDINGS No fracture or malalignment is identified. The patient is status post C3-6 fusion. Hardware in C6 has been removed. No bridging bone is seen across the C3-4 disc interspace consistent with pseudoarthrosis. Lung apices are clear. IMPRESSION: No acute abnormality head or cervical spine. Atrophy and chronic microvascular ischemic change. 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 Electronically Signed   By: Drusilla Kanner M.D.   On: 08/01/2015 15:22  Ct Cervical Spine Wo Contrast  Result Date: 08/01/2015 CLINICAL DATA:  Status post fall 9 days ago. The patient reports difficulty walking since the fall. Initial encounter. EXAM: CT HEAD WITHOUT CONTRAST CT CERVICAL SPINE WITHOUT CONTRAST TECHNIQUE: Multidetector CT imaging of the head and cervical spine was performed following the standard protocol without intravenous contrast. Multiplanar CT image reconstructions of the cervical  spine were also generated. COMPARISON:  Head and cervical spine CT scans 06/30/2015. FINDINGS: CT HEAD FINDINGS There is cortical atrophy and chronic microvascular ischemic change. Remote lacunar infarction right caudate head is unchanged. No evidence of acute intracranial abnormality including hemorrhage, infarct, mass lesion, mass effect, midline shift or abnormal extra-axial fluid collection is identified. No hydrocephalus or pneumocephalus. The calvarium is intact. Carotid atherosclerosis is noted. Imaged paranasal sinuses and mastoid air cells are clear. CT CERVICAL SPINE FINDINGS No fracture or malalignment is identified. The patient is status post  C3-6 fusion. Hardware in C6 has been removed. No bridging bone is seen across the C3-4 disc interspace consistent with pseudoarthrosis. Lung apices are clear. IMPRESSION: No acute abnormality head or cervical spine. Atrophy and chronic microvascular ischemic change. 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 Electronically Signed   By: Drusilla Kanner M.D.   On: 08/01/2015 15:22  Ct Hip Right Wo Contrast  Result Date: 08/01/2015 CLINICAL DATA:  Larey Seat 9 days ago.  Persistent right hip pain. EXAM: CT OF THE RIGHT HIP WITHOUT CONTRAST TECHNIQUE: Multidetector CT imaging of the right hip was performed according to the standard protocol. Multiplanar CT image reconstructions were also generated. COMPARISON:  Radiograph 08/01/2015 FINDINGS: Advanced right hip joint degenerative changes with joint space narrowing, osteophytic spurring and subchondral cystic change. Os acetabuli noted. No acute hip fracture is identified. No evidence of AVN. The visualized right hemipelvis is intact. No definite pubic rami or lower cervical fracture. Osteochondroma versus remote avulsion fracture at the anterior superior iliac spine. Advanced vascular calcifications for age. IMPRESSION: 1. No acute fracture. 2. Moderate to advanced right hip joint degenerative changes for age. 3. Advanced atherosclerotic calcifications for age. Electronically Signed   By: Rudie Meyer M.D.   On: 08/01/2015 16:25  Dg Shoulder Left  Result Date: 08/01/2015 CLINICAL DATA:  Status post fall 11 days ago.  Left shoulder pain. EXAM: LEFT SHOULDER - 2+ VIEW COMPARISON:  Plain films left shoulder 07/07/2015. FINDINGS: Subacute fracture of the distal left clavicle is seen as on the prior exam. There has been some bony resorption about fracture margins since the prior examination. No new fracture is identified. The acromioclavicular joint is intact. The shoulder is located. Calcified granulomata within the left lung are noted.  IMPRESSION: No acute abnormality. Subacute fracture of the distal left clavicle as seen on the prior exam. Electronically Signed   By: Drusilla Kanner M.D.   On: 08/01/2015 15:11  Dg Hip Unilat With Pelvis 2-3 Views Right  Result Date: 08/01/2015 CLINICAL DATA:  Fall 10 days ago with right hip pain EXAM: DG HIP (WITH OR WITHOUT PELVIS) 2-3V RIGHT COMPARISON:  None. FINDINGS: Frontal pelvis shows no fracture. SI joints and symphysis pubis are unremarkable. No inferior or superior pubic ramus fracture evident. AP and frog-leg lateral views of the right hip show no definite fracture although assessment is limited by positioning. IMPRESSION: No definite right femoral neck fracture although study limited by patient positioning. CT or MRI may prove helpful to further evaluate if there is high clinical suspicion for femoral neck fracture. Electronically Signed   By: Kennith Center M.D.   On: 08/01/2015 15:06   Procedures Procedures (including critical care time)  Medications Ordered in ED   Initial Impression / Assessment and Plan / ED Course  I have reviewed the triage vital signs and the nursing notes.  Pertinent labs & imaging results that were available during my care of the patient were reviewed  by me and considered in my medical decision making (see chart for details).  Clinical Course  Comment By Time  Patient seen and examined. Patient is a poor historian, still unclear if he has fallen today or several days ago. We'll get imaging of the hip, shoulder, CT head and cervical spine. Patient did report that he had some dizziness and lightheadedness prior to the fall. Will check EKG, labs, troponin. Jaynie Crumble, PA-C 07/26 1446    Pt after a syncopal episode, multiple falls recently. Will get xray of hip, shoulder, will get labs, ecg  pts xray and ct hip negative. CT heat and cervical spine negative. Pt has a uti. Orthostatic. Will admit for hydration, uti tretment, syncope. ecg and trop  negative.    Final Clinical Impressions(s) / ED Diagnoses   Final diagnoses:  Syncope, unspecified syncope type  Dehydration  UTI (lower urinary tract infection)  Hip pain, right    New Prescriptions    Jaynie Crumble, PA-C 08/02/15 2210    Courteney Lyn Mackuen, MD 08/02/15 2330

## 2015-08-02 DIAGNOSIS — Z8673 Personal history of transient ischemic attack (TIA), and cerebral infarction without residual deficits: Secondary | ICD-10-CM | POA: Diagnosis not present

## 2015-08-02 DIAGNOSIS — D696 Thrombocytopenia, unspecified: Secondary | ICD-10-CM | POA: Diagnosis present

## 2015-08-02 DIAGNOSIS — E860 Dehydration: Secondary | ICD-10-CM | POA: Diagnosis present

## 2015-08-02 DIAGNOSIS — L8990 Pressure ulcer of unspecified site, unspecified stage: Secondary | ICD-10-CM | POA: Insufficient documentation

## 2015-08-02 DIAGNOSIS — F17200 Nicotine dependence, unspecified, uncomplicated: Secondary | ICD-10-CM | POA: Diagnosis not present

## 2015-08-02 DIAGNOSIS — Z66 Do not resuscitate: Secondary | ICD-10-CM | POA: Diagnosis present

## 2015-08-02 DIAGNOSIS — R55 Syncope and collapse: Secondary | ICD-10-CM | POA: Diagnosis present

## 2015-08-02 DIAGNOSIS — S42002D Fracture of unspecified part of left clavicle, subsequent encounter for fracture with routine healing: Secondary | ICD-10-CM | POA: Diagnosis not present

## 2015-08-02 DIAGNOSIS — Z590 Homelessness: Secondary | ICD-10-CM | POA: Diagnosis not present

## 2015-08-02 DIAGNOSIS — Z8249 Family history of ischemic heart disease and other diseases of the circulatory system: Secondary | ICD-10-CM | POA: Diagnosis not present

## 2015-08-02 DIAGNOSIS — Z79899 Other long term (current) drug therapy: Secondary | ICD-10-CM | POA: Diagnosis not present

## 2015-08-02 DIAGNOSIS — W19XXXD Unspecified fall, subsequent encounter: Secondary | ICD-10-CM | POA: Diagnosis present

## 2015-08-02 DIAGNOSIS — N390 Urinary tract infection, site not specified: Secondary | ICD-10-CM

## 2015-08-02 DIAGNOSIS — F1010 Alcohol abuse, uncomplicated: Secondary | ICD-10-CM | POA: Diagnosis present

## 2015-08-02 DIAGNOSIS — I951 Orthostatic hypotension: Secondary | ICD-10-CM | POA: Diagnosis not present

## 2015-08-02 DIAGNOSIS — F17210 Nicotine dependence, cigarettes, uncomplicated: Secondary | ICD-10-CM | POA: Diagnosis present

## 2015-08-02 LAB — COMPREHENSIVE METABOLIC PANEL
ALK PHOS: 66 U/L (ref 38–126)
ALT: 9 U/L — ABNORMAL LOW (ref 17–63)
ANION GAP: 8 (ref 5–15)
AST: 23 U/L (ref 15–41)
Albumin: 3.5 g/dL (ref 3.5–5.0)
BILIRUBIN TOTAL: 0.7 mg/dL (ref 0.3–1.2)
BUN: 20 mg/dL (ref 6–20)
CALCIUM: 8.2 mg/dL — AB (ref 8.9–10.3)
CO2: 25 mmol/L (ref 22–32)
Chloride: 107 mmol/L (ref 101–111)
Creatinine, Ser: 0.96 mg/dL (ref 0.61–1.24)
GFR calc Af Amer: >60 mL/min (ref >60)
GLUCOSE: 97 mg/dL (ref 65–99)
POTASSIUM: 3.4 mmol/L — AB (ref 3.5–5.1)
Sodium: 140 mmol/L (ref 135–145)
TOTAL PROTEIN: 6.6 g/dL (ref 6.5–8.1)

## 2015-08-02 LAB — IRON AND TIBC
IRON: 69 ug/dL (ref 45–182)
Saturation Ratios: 34 % (ref 17.9–39.5)
TIBC: 202 ug/dL — AB (ref 250–450)
UIBC: 133 ug/dL

## 2015-08-02 LAB — CBC
HEMATOCRIT: 33.8 % — AB (ref 39.0–52.0)
Hemoglobin: 11.1 g/dL — ABNORMAL LOW (ref 13.0–17.0)
MCH: 29.1 pg (ref 26.0–34.0)
MCHC: 32.8 g/dL (ref 30.0–36.0)
MCV: 88.7 fL (ref 78.0–100.0)
Platelets: 66 10*3/uL — ABNORMAL LOW (ref 150–400)
RBC: 3.81 MIL/uL — ABNORMAL LOW (ref 4.22–5.81)
RDW: 14.1 % (ref 11.5–15.5)
WBC: 3.6 10*3/uL — AB (ref 4.0–10.5)

## 2015-08-02 LAB — RETICULOCYTES: RBC.: 3.72 MIL/uL — ABNORMAL LOW (ref 4.22–5.81)

## 2015-08-02 LAB — FOLATE: Folate: 55.5 ng/mL (ref >5.9)

## 2015-08-02 LAB — VITAMIN B12: VITAMIN B 12: 839 pg/mL (ref 180–914)

## 2015-08-02 LAB — FERRITIN: FERRITIN: 377 ng/mL — AB (ref 24–336)

## 2015-08-02 MED ORDER — THIAMINE HCL 100 MG PO TABS: 100.0000 mg | ORAL_TABLET | Freq: Every day | ORAL | 0 refills | Status: DC

## 2015-08-02 MED ORDER — CEFPODOXIME PROXETIL 200 MG PO TABS: 200.0000 mg | ORAL_TABLET | Freq: Two times a day (BID) | ORAL | 0 refills | Status: DC

## 2015-08-02 MED ORDER — SODIUM CHLORIDE 0.9 % IV BOLUS (SEPSIS)
1000.0000 mL | Freq: Once | INTRAVENOUS | Status: AC
  Administered 2015-08-02: 1000 mL via INTRAVENOUS

## 2015-08-02 MED ORDER — ADULT MULTIVITAMIN W/MINERALS CH: 1.0000 | ORAL_TABLET | Freq: Every day | ORAL | 0 refills | Status: DC

## 2015-08-02 MED ORDER — CETYLPYRIDINIUM CHLORIDE 0.05 % MT LIQD
7.0000 mL | Freq: Two times a day (BID) | OROMUCOSAL | Status: DC
  Administered 2015-08-02 – 2015-08-03 (×3): 7 mL via OROMUCOSAL

## 2015-08-02 MED ORDER — POTASSIUM CHLORIDE CRYS ER 20 MEQ PO TBCR
40.0000 meq | EXTENDED_RELEASE_TABLET | ORAL | Status: AC
  Administered 2015-08-02: 40 meq via ORAL
  Filled 2015-08-02: qty 2

## 2015-08-02 MED ORDER — FOLIC ACID 1 MG PO TABS: 1.0000 mg | ORAL_TABLET | Freq: Every day | ORAL | 0 refills | Status: DC

## 2015-08-02 NOTE — Progress Notes (Signed)
Pt is homeless, Denny Peon, CSW made aware. Will continue to follow for discharge needs.

## 2015-08-02 NOTE — Progress Notes (Signed)
PROGRESS NOTE    Albert Bond  WUJ:[id number] DOB: Aug 31, 1950 DOA: 08/01/2015  PCP: Doris Cheadle, MD   Brief Narrative:  Albert Bond is a 65 y.o. 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 Not on any medications. He is homeless and felt dizzy when walking down a hill and subsequently passed out. EMS was called by a passerby. No seizure activity. No loss of control of bowel and bladder He was in the ER on 7/1 for a fall and found to have a left clavicle fracture. He has been having occasional pain in this area.  Subjective: No complaints.   Assessment & Plan:  Active Problems:   Syncope - likely orthostatic hypotension - hydrated and no longer orthostatic - following in telemetry    Dehydration - cont IVF today- give another NS bolus of 1 L    UTI (lower urinary tract infection) - Rocephin - follow up on post void residual - f/u culture    Tobacco use disorder - nicotine patch  Alcohol abuse - CIWA scale although he states he drinks only occasionally     Thrombocytopenia / elevated Bili - possibly due to above- follow>> bili improved & platelets stable  Benzodiazepines on UDS - patient states he does not use these- likely due to Ativan given in ER  Homeless - social work assisting with this     DVT prophylaxis: SCDs Code Status: full code Family Communication:  Disposition Plan: discharge in 1-2 days Consultants:   none Procedures:   none Antimicrobials:  Anti-infectives    Start     Dose/Rate Route Frequency Ordered Stop   08/02/15 1700  cefTRIAXone (ROCEPHIN) 1 g in dextrose 5 % 50 mL IVPB     1 g 100 mL/hr over 30 Minutes Intravenous Every 24 hours 08/01/15 1743     08/02/15 0000  cefpodoxime (VANTIN) 200 MG tablet     200 mg Oral 2 times daily 08/02/15 1446     08/01/15 1645  cefTRIAXone (ROCEPHIN) 1 g in dextrose 5 % 50 mL IVPB     1 g 100 mL/hr  over 30 Minutes Intravenous  Once 08/01/15 1640 08/02/15 0418       Objective: Vitals:   08/01/15 1834 08/01/15 2121 08/02/15 0614 08/02/15 1335  BP: (!) 139/98 123/81 104/73 124/84  Pulse: 76 72 66 (!) 46  Resp: 18 18 18 16   Temp: 97.9 F (36.6 C) 97.6 F (36.4 C) 98.2 F (36.8 C) 97.9 F (36.6 C)  TempSrc: Axillary Oral Oral Oral  SpO2:  100% 100% 99%  Weight:      Height:        Intake/Output Summary (Last 24 hours) at 08/02/15 1525 Last data filed at 08/02/15 1333  Gross per 24 hour  Intake          1936.25 ml  Output             1160 ml  Net           776.25 ml   Filed Weights   08/01/15 1833  Weight: 55.8 kg (123 lb 0.3 oz)    Examination: General exam: Appears comfortable  HEENT: PERRLA, oral mucosa moist, no sclera icterus or thrush Respiratory system: Clear to auscultation. Respiratory effort normal. Cardiovascular system: S1 & S2 heard, RRR.  No murmurs  Gastrointestinal system: Abdomen soft, non-tender, nondistended. Normal bowel sound. No organomegaly Central nervous system: Alert and oriented. No focal neurological  deficits. Extremities: No cyanosis, clubbing or edema Skin: No rashes or ulcers Psychiatry:  Mood & affect appropriate.     Data Reviewed: I have personally reviewed following labs and imaging studies  CBC:  Recent Labs Lab 08/01/15 1504 08/02/15 0506  WBC 4.1 3.6*  NEUTROABS 2.4  --   HGB 13.2 11.1*  HCT 40.3 33.8*  MCV 89.6 88.7  PLT 78* 66*   Basic Metabolic Panel:  Recent Labs Lab 08/01/15 1504 08/02/15 0821  NA 142 140  K 4.6 3.4*  CL 103 107  CO2 23 25  GLUCOSE 75 97  BUN 33* 20  CREATININE 1.55* 0.96  CALCIUM 9.7 8.2*   GFR: Estimated Creatinine Clearance: 62.2 mL/min (by C-G formula based on SCr of 0.96 mg/dL). Liver Function Tests:  Recent Labs Lab 08/01/15 1504 08/02/15 0821  AST 28 23  ALT 11* 9*  ALKPHOS 84 66  BILITOT 1.5* 0.7  PROT 8.2* 6.6  ALBUMIN 4.6 3.5   No results for input(s):  LIPASE, AMYLASE in the last 168 hours. No results for input(s): AMMONIA in the last 168 hours. Coagulation Profile: No results for input(s): INR, PROTIME in the last 168 hours. Cardiac Enzymes: No results for input(s): CKTOTAL, CKMB, CKMBINDEX, TROPONINI in the last 168 hours. BNP (last 3 results) No results for input(s): PROBNP in the last 8760 hours. HbA1C: No results for input(s): HGBA1C in the last 72 hours. CBG: No results for input(s): GLUCAP in the last 168 hours. Lipid Profile: No results for input(s): CHOL, HDL, LDLCALC, TRIG, CHOLHDL, LDLDIRECT in the last 72 hours. Thyroid Function Tests: No results for input(s): TSH, T4TOTAL, FREET4, T3FREE, THYROIDAB in the last 72 hours. Anemia Panel: No results for input(s): VITAMINB12, FOLATE, FERRITIN, TIBC, IRON, RETICCTPCT in the last 72 hours. Urine analysis:    Component Value Date/Time   COLORURINE AMBER (A) 08/01/2015 1600   APPEARANCEUR CLOUDY (A) 08/01/2015 1600   LABSPEC 1.028 08/01/2015 1600   PHURINE 5.5 08/01/2015 1600   GLUCOSEU NEGATIVE 08/01/2015 1600   HGBUR NEGATIVE 08/01/2015 1600   BILIRUBINUR LARGE (A) 08/01/2015 1600   KETONESUR 40 (A) 08/01/2015 1600   PROTEINUR 30 (A) 08/01/2015 1600   UROBILINOGEN 0.2 09/01/2013 1404   NITRITE NEGATIVE 08/01/2015 1600   LEUKOCYTESUR MODERATE (A) 08/01/2015 1600   Sepsis Labs: @LABRCNTIP (procalcitonin:4,lacticidven:4) )No results found for this or any previous visit (from the past 240 hour(s)).       Radiology Studies: Ct Head Wo Contrast  Result Date: 08/01/2015 CLINICAL DATA:  Status post fall 9 days ago. The patient reports difficulty walking since the fall. Initial encounter. EXAM: CT HEAD WITHOUT CONTRAST CT CERVICAL SPINE WITHOUT CONTRAST TECHNIQUE: Multidetector CT imaging of the head and cervical spine was performed following the standard protocol without intravenous contrast. Multiplanar CT image reconstructions of the cervical spine were also generated.  COMPARISON:  Head and cervical spine CT scans 06/30/2015. FINDINGS: CT HEAD FINDINGS There is cortical atrophy and chronic microvascular ischemic change. Remote lacunar infarction right caudate head is unchanged. No evidence of acute intracranial abnormality including hemorrhage, infarct, mass lesion, mass effect, midline shift or abnormal extra-axial fluid collection is identified. No hydrocephalus or pneumocephalus. The calvarium is intact. Carotid atherosclerosis is noted. Imaged paranasal sinuses and mastoid air cells are clear. CT CERVICAL SPINE FINDINGS No fracture or malalignment is identified. The patient is status post C3-6 fusion. Hardware in C6 has been removed. No bridging bone is seen across the C3-4 disc interspace consistent with pseudoarthrosis. Lung apices are clear. IMPRESSION:  No acute abnormality head or cervical spine. Atrophy and chronic microvascular ischemic change. 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 Electronically Signed   By: Drusilla Kanner M.D.   On: 08/01/2015 15:22  Ct Cervical Spine Wo Contrast  Result Date: 08/01/2015 CLINICAL DATA:  Status post fall 9 days ago. The patient reports difficulty walking since the fall. Initial encounter. EXAM: CT HEAD WITHOUT CONTRAST CT CERVICAL SPINE WITHOUT CONTRAST TECHNIQUE: Multidetector CT imaging of the head and cervical spine was performed following the standard protocol without intravenous contrast. Multiplanar CT image reconstructions of the cervical spine were also generated. COMPARISON:  Head and cervical spine CT scans 06/30/2015. FINDINGS: CT HEAD FINDINGS There is cortical atrophy and chronic microvascular ischemic change. Remote lacunar infarction right caudate head is unchanged. No evidence of acute intracranial abnormality including hemorrhage, infarct, mass lesion, mass effect, midline shift or abnormal extra-axial fluid collection is identified. No hydrocephalus or pneumocephalus. The  calvarium is intact. Carotid atherosclerosis is noted. Imaged paranasal sinuses and mastoid air cells are clear. CT CERVICAL SPINE FINDINGS No fracture or malalignment is identified. The patient is status post C3-6 fusion. Hardware in C6 has been removed. No bridging bone is seen across the C3-4 disc interspace consistent with pseudoarthrosis. Lung apices are clear. IMPRESSION: No acute abnormality head or cervical spine. Atrophy and chronic microvascular ischemic change. 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 Electronically Signed   By: Drusilla Kanner M.D.   On: 08/01/2015 15:22  Ct Hip Right Wo Contrast  Result Date: 08/01/2015 CLINICAL DATA:  Larey Seat 9 days ago.  Persistent right hip pain. EXAM: CT OF THE RIGHT HIP WITHOUT CONTRAST TECHNIQUE: Multidetector CT imaging of the right hip was performed according to the standard protocol. Multiplanar CT image reconstructions were also generated. COMPARISON:  Radiograph 08/01/2015 FINDINGS: Advanced right hip joint degenerative changes with joint space narrowing, osteophytic spurring and subchondral cystic change. Os acetabuli noted. No acute hip fracture is identified. No evidence of AVN. The visualized right hemipelvis is intact. No definite pubic rami or lower cervical fracture. Osteochondroma versus remote avulsion fracture at the anterior superior iliac spine. Advanced vascular calcifications for age. IMPRESSION: 1. No acute fracture. 2. Moderate to advanced right hip joint degenerative changes for age. 3. Advanced atherosclerotic calcifications for age. Electronically Signed   By: Rudie Meyer M.D.   On: 08/01/2015 16:25  Dg Shoulder Left  Result Date: 08/01/2015 CLINICAL DATA:  Status post fall 11 days ago.  Left shoulder pain. EXAM: LEFT SHOULDER - 2+ VIEW COMPARISON:  Plain films left shoulder 07/07/2015. FINDINGS: Subacute fracture of the distal left clavicle is seen as on the prior exam. There has been some bony  resorption about fracture margins since the prior examination. No new fracture is identified. The acromioclavicular joint is intact. The shoulder is located. Calcified granulomata within the left lung are noted. IMPRESSION: No acute abnormality. Subacute fracture of the distal left clavicle as seen on the prior exam. Electronically Signed   By: Drusilla Kanner M.D.   On: 08/01/2015 15:11  Dg Hip Unilat With Pelvis 2-3 Views Right  Result Date: 08/01/2015 CLINICAL DATA:  Fall 10 days ago with right hip pain EXAM: DG HIP (WITH OR WITHOUT PELVIS) 2-3V RIGHT COMPARISON:  None. FINDINGS: Frontal pelvis shows no fracture. SI joints and symphysis pubis are unremarkable. No inferior or superior pubic ramus fracture evident. AP and frog-leg lateral views of the right hip show no definite fracture although assessment  is limited by positioning. IMPRESSION: No definite right femoral neck fracture although study limited by patient positioning. CT or MRI may prove helpful to further evaluate if there is high clinical suspicion for femoral neck fracture. Electronically Signed   By: Kennith Center M.D.   On: 08/01/2015 15:06     Scheduled Meds: . sodium chloride   Intravenous STAT  . antiseptic oral rinse  7 mL Mouth Rinse BID  . cefTRIAXone (ROCEPHIN)  IV  1 g Intravenous Q24H  . feeding supplement (ENSURE ENLIVE)  237 mL Oral BID BM  . folic acid  1 mg Oral Daily  . multivitamin with minerals  1 tablet Oral Daily  . nicotine  14 mg Transdermal Daily  . potassium chloride  40 mEq Oral Q4H  . thiamine  100 mg Oral Daily   Or  . thiamine  100 mg Intravenous Daily   Continuous Infusions: . sodium chloride 125 mL/hr at 08/02/15 0418     LOS: 0 days    Time spent in minutes: 35    Abie Killian, MD [facility] Pager: [email or web address] Password [facility] 08/02/2015, 3:25 PM

## 2015-08-02 NOTE — Progress Notes (Signed)
Initial Nutrition Assessment  DOCUMENTATION CODES:   Severe malnutrition in context of acute illness/injury  INTERVENTION:  -Ensure Enlive po TID, each supplement provides 350 kcal and 20 grams of protein -RD to continue to monitor  NUTRITION DIAGNOSIS:  Malnutrition related to acute illness as evidenced by energy intake < or equal to 50% for > or equal to 5 days, moderate depletion of body fat, moderate depletions of muscle mass.  GOAL:  Patient will meet greater than or equal to 90% of their needs  MONITOR:  Supplement acceptance, I & O's, Labs, PO intake, Weight trends  REASON FOR ASSESSMENT:   Malnutrition Screening Tool    ASSESSMENT:   Jeffy Korch is a 65 y.o. 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 Not on any medications. He is homeless and felt dizzy when walking down a hill and subsequently passed out  Spoke with Mr. Lamport at bedside. He endorses good appetite today. Consumed 100% of Pot Roast, Pintos, and Cornbread dressing per pt. PTA, pt states he did not eat for 9 days "I called myself fasting." Question patient's access to food. When I asked him if this was a normal occurrence he states "no." But he did not elaborate further. Pt states his normal wt as 165# as recent as 1 month ago. Per chart review pt's highest recorded wt is 141# as of 08/2013. With stated wt pt would exhibit a 42#/25% severe wt loss in 1 month. Nutrition-Focused physical exam completed. Findings are moderate fat depletion, moderate-severe muscle depletion, and no edema.   With PO intake <50% for > 5 days and moderate fat depletions, moderate-severe muscle depletions, it is safe to diagnose pt with severe malnutrition in context of acute illness.  Pt also mentioned he may drink anywhere from 1.5 - 2 fifths of liquor "depending on how many girls I'm with." Per MD note pt does have a hx of ETOH abuse.  Labs and  medications reviewed: K 3.4, NS @ 124mL/hr Banana Bag, KCL   Diet Order:  Diet regular Room service appropriate? Yes; Fluid consistency: Thin  Skin:  Wound (see comment) (Stg II to sacrum)  Last BM:  7/26  Height:   Ht Readings from Last 1 Encounters:  08/01/15 5\' 7"  (1.702 m)    Weight:   Wt Readings from Last 1 Encounters:  08/01/15 123 lb 0.3 oz (55.8 kg)    Ideal Body Weight:     BMI:  Body mass index is 19.27 kg/m.  Estimated Nutritional Needs:   Kcal:  1650-1950 calories  Protein:  56-70 grams  Fluid:  >/= 1.65L  EDUCATION NEEDS:   No education needs identified at this time  Dionne Ano. Ceci Taliaferro, MS, RD LDN Inpatient Clinical Dietitian Pager [phone]

## 2015-08-03 DIAGNOSIS — F1010 Alcohol abuse, uncomplicated: Secondary | ICD-10-CM

## 2015-08-03 DIAGNOSIS — L8990 Pressure ulcer of unspecified site, unspecified stage: Secondary | ICD-10-CM

## 2015-08-03 LAB — URINALYSIS, ROUTINE W REFLEX MICROSCOPIC
Bilirubin Urine: NEGATIVE
Glucose, UA: NEGATIVE mg/dL
Hgb urine dipstick: NEGATIVE
KETONES UR: NEGATIVE mg/dL
LEUKOCYTES UA: NEGATIVE
NITRITE: NEGATIVE
PROTEIN: NEGATIVE mg/dL
Specific Gravity, Urine: 1.016 (ref 1.005–1.030)
pH: 5.5 (ref 5.0–8.0)

## 2015-08-03 LAB — URINE CULTURE

## 2015-08-03 MED ORDER — CEFPODOXIME PROXETIL 200 MG PO TABS: 200.0000 mg | ORAL_TABLET | Freq: Two times a day (BID) | ORAL | 0 refills | Status: DC

## 2015-08-03 NOTE — Progress Notes (Signed)
Patient would like to return to his camp once ready for DC. CSW gave patient resources and gave them information about [employer]. CSW will give RN bus pass for patient once ready to leave. Please consult CSW for any DC needs.   Stacy Gardner, LCSWA Clinical Social Worker [phone]

## 2015-08-03 NOTE — Evaluation (Signed)
Physical Therapy Evaluation Patient Details Name: Albert Bond MRN: [id number] DOB: 10-27-1950 Today's Date: 08/03/2015   History of Present Illness  65 y.o. male with medical history significant of severe cord compression in cervical area s/p ACDF who is homeless and admitted for syncope and dehydration  Clinical Impression  Pt admitted with above diagnosis. Pt currently with functional limitations due to the deficits listed below (see PT Problem List).  Pt will benefit from skilled PT to increase their independence and safety with mobility to allow discharge to the venue listed below.   Pt currently min/guard for mobility and tolerated ambulation well with RW.  Would recommend pt use RW upon d/c, requiring UE support for steadying with mobility.     Follow Up Recommendations Home health PT    Equipment Recommendations  Rolling walker with 5" wheels    Recommendations for Other Services       Precautions / Restrictions Precautions Precautions: Fall Precaution Comments: hx of distal L clavicle fx 7/1 - pt reports he has been moving/using L UE      Mobility  Bed Mobility Overal bed mobility: Modified Independent             General bed mobility comments: HOB elevated  Transfers Overall transfer level: Needs assistance Equipment used: Rolling walker (2 wheeled) Transfers: Sit to/from Stand Sit to Stand: Min guard         General transfer comment: verbal cues for hand placement  Ambulation/Gait Ambulation/Gait assistance: Min guard Ambulation Distance (Feet): 140 Feet Assistive device: Rolling walker (2 wheeled) Gait Pattern/deviations: Narrow base of support;Scissoring;Decreased stride length;Step-through pattern     General Gait Details: very narrow BOS with occasional scissoring, no LOB or unsteadiness with RW, verbal cues for RW positioning, pt reports mild dizziness with looking upwards; improved with looking downwards  Stairs             Wheelchair Mobility    Modified Rankin (Stroke Patients Only)       Balance Overall balance assessment: History of Falls                                           Pertinent Vitals/Pain Pain Assessment: Faces Faces Pain Scale: Hurts little more Pain Location: L shoulder Pain Descriptors / Indicators: Discomfort;Sore Pain Intervention(s): Limited activity within patient's tolerance;Monitored during session    Home Living Family/patient expects to be discharged to:: Unsure                 Additional Comments: per chart homeless, was at one point living with family per RN    Prior Function Level of Independence: Independent with assistive device(s)         Comments: uses SPC     Hand Dominance        Extremity/Trunk Assessment               Lower Extremity Assessment: Generalized weakness         Communication   Communication: No difficulties  Cognition Arousal/Alertness: Awake/alert Behavior During Therapy: WFL for tasks assessed/performed Overall Cognitive Status: Within Functional Limits for tasks assessed                      General Comments      Exercises        Assessment/Plan    PT Assessment Patient needs continued PT services  PT Diagnosis Difficulty walking   PT Problem List Decreased strength;Decreased mobility;Decreased balance;Decreased knowledge of use of DME  PT Treatment Interventions DME instruction;Gait training;Functional mobility training;Therapeutic activities;Patient/family education;Therapeutic exercise;Balance training   PT Goals (Current goals can be found in the Care Plan section) Acute Rehab PT Goals PT Goal Formulation: With patient Time For Goal Achievement: 08/10/15 Potential to Achieve Goals: Good    Frequency Min 3X/week   Barriers to discharge        Co-evaluation               End of Session Equipment Utilized During Treatment: Gait belt Activity Tolerance:  Patient tolerated treatment well Patient left: in bed;with call bell/phone within reach;with bed alarm set Nurse Communication: Mobility status         Time: 1610-9604 PT Time Calculation (min) (ACUTE ONLY): 16 min   Charges:   PT Evaluation $PT Eval Low Complexity: 1 Procedure     PT G Codes:        Warren Kugelman,KATHrine E 08/03/2015, 12:27 PM Zenovia Jarred, PT, DPT 08/03/2015 Pager: [phone]

## 2015-08-03 NOTE — Clinical Social Work Note (Signed)
Clinical Social Work Assessment  Patient Details  Name: Albert Bond MRN: [id number] Date of Birth: Nov 07, 1950  Date of referral:  08/02/15               Reason for consult:  Housing Concerns/Homelessness, Discharge Planning                Permission sought to share information with:  Facility Industrial/product designer granted to share information::  Yes, Verbal Permission Granted  Name::        Agency::     Relationship::     Contact Information:     Housing/Transportation Living arrangements for the past 2 months:  Homeless Source of Information:  Patient Patient Interpreter Needed:  None Criminal Activity/Legal Involvement Pertinent to Current Situation/Hospitalization:    Significant Relationships:  None Lives with:  Self Do you feel safe going back to the place where you live?  No Need for family participation in patient care:  Yes (Comment)  Care giving concerns:  CSW received consult to speak with patient regarding discharge concerns and his living arrangements.    Social Worker assessment / plan:  CSW spoke with patient at bedside about his current living arrangements. Patient is currently homeless and has been living at a "camp". Patient does not like staying at the shelters in the area and prefers to be on his own. Patients nurse mentioned to him that physical therapy might recommend SNF and he would be able to receive rehab. Patient is hoping he will be able to receive rehab once ready for discharge.   Employment status:  Unemployed Health and safety inspector:  Medicaid In [place] PT Recommendations:  Not assessed at this time Information / Referral to community resources:  Skilled Nursing Facility  Patient/Family's Response to care:  Patient was appreciative of CSW.   Patient/Family's Understanding of and Emotional Response to Diagnosis, Current Treatment, and Prognosis:  Patient understands that he may or may not be able to receive short-term rehab once  ready for discharge. CSW will provide resources if he is unable.   Emotional Assessment Appearance:  Appears stated age Attitude/Demeanor/Rapport:    Affect (typically observed):  Adaptable, Pleasant, Appropriate Orientation:  Oriented to Self, Oriented to Place, Oriented to  Time, Oriented to Situation Alcohol / Substance use:    Psych involvement (Current and /or in the community):  No (Comment)  Discharge Needs  Concerns to be addressed:  Homelessness, Basic Needs Readmission within the last 30 days:  No Current discharge risk:  None Barriers to Discharge:  No Barriers Identified   Donnie Coffin, LCSW 08/03/2015, 10:13 AM

## 2015-08-03 NOTE — NC FL2 (Signed)
[place] MEDICAID FL2 LEVEL OF CARE SCREENING TOOL     IDENTIFICATION  Patient Name: Albert Bond Birthdate: 12-25-1950 Sex: male Admission Date (Current Location): 08/01/2015  [facility] and [place] Number:  Producer, television/film/video and Address:  [facility],  [address] [place] [address]      Provider Number: [id number]  Attending Physician Name and Address:  Calvert Cantor, MD  Relative Name and Phone Number:       Current Level of Care: Hospital Recommended Level of Care: Skilled Nursing Facility Prior Approval Number:    Date Approved/Denied: 08/03/15 PASRR Number: [id number]  Discharge Plan: SNF    Current Diagnoses: Patient Active Problem List   Diagnosis Date Noted  . Pressure ulcer 08/02/2015  . Dehydration 08/01/2015  . Thrombocytopenia (HCC) 08/01/2015  . UTI (lower urinary tract infection) 08/01/2015  . Tobacco use disorder 09/12/2013  . Left-sided weakness 08/24/2013  . Syncope 08/24/2013  . Anemia 08/24/2013    Orientation RESPIRATION BLADDER Height & Weight     Self, Time, Place, Situation  Normal Continent Weight: 123 lb 0.3 oz (55.8 kg) Height:   (170.2 cm)  BEHAVIORAL SYMPTOMS/MOOD NEUROLOGICAL BOWEL NUTRITION STATUS      Continent Diet (Regular)  AMBULATORY STATUS COMMUNICATION OF NEEDS Skin   Limited Assist Verbally Normal                       Personal Care Assistance Level of Assistance  Bathing, Dressing Bathing Assistance: Limited assistance   Dressing Assistance: Limited assistance     Functional Limitations Info             SPECIAL CARE FACTORS FREQUENCY  PT (By licensed PT), OT (By licensed OT)     PT Frequency: 5 OT Frequency: 5            Contractures      Additional Factors Info  Code Status, Allergies Code Status Info: DNR Allergies Info: NKA           Current Medications (08/03/2015):  This is the current hospital active medication list Current Facility-Administered  Medications  Medication Dose Route Frequency Provider Last Rate Last Dose  . 0.9 %  sodium chloride infusion   Intravenous Continuous Calvert Cantor, MD 125 mL/hr at 08/03/15 5621    . acetaminophen (TYLENOL) tablet 650 mg  650 mg Oral Q6H PRN Calvert Cantor, MD   650 mg at 08/02/15 0859   Or  . acetaminophen (TYLENOL) suppository 650 mg  650 mg Rectal Q6H PRN Calvert Cantor, MD      . antiseptic oral rinse (CPC / CETYLPYRIDINIUM CHLORIDE 0.05%) solution 7 mL  7 mL Mouth Rinse BID Calvert Cantor, MD   7 mL at 08/02/15 2059  . cefTRIAXone (ROCEPHIN) 1 g in dextrose 5 % 50 mL IVPB  1 g Intravenous Q24H Calvert Cantor, MD   1 g at 08/02/15 1706  . feeding supplement (ENSURE ENLIVE) (ENSURE ENLIVE) liquid 237 mL  237 mL Oral BID BM Calvert Cantor, MD   237 mL at 08/02/15 1414  . folic acid (FOLVITE) tablet 1 mg  1 mg Oral Daily Calvert Cantor, MD   1 mg at 08/02/15 0859  . LORazepam (ATIVAN) tablet 1 mg  1 mg Oral Q6H PRN Calvert Cantor, MD   1 mg at 08/02/15 2058   Or  . LORazepam (ATIVAN) injection 1 mg  1 mg Intravenous Q6H PRN Calvert Cantor, MD      . multivitamin  with minerals tablet 1 tablet  1 tablet Oral Daily Calvert Cantor, MD   1 tablet at 08/02/15 0900  . nicotine (NICODERM CQ - dosed in mg/24 hours) patch 14 mg  14 mg Transdermal Daily Calvert Cantor, MD   14 mg at 08/02/15 0900  . ondansetron (ZOFRAN) tablet 4 mg  4 mg Oral Q6H PRN Calvert Cantor, MD      . thiamine (VITAMIN B-1) tablet 100 mg  100 mg Oral Daily Calvert Cantor, MD   100 mg at 08/02/15 0900   Or  . thiamine (B-1) injection 100 mg  100 mg Intravenous Daily Calvert Cantor, MD         Discharge Medications: Please see discharge summary for a list of discharge medications.  Relevant Imaging Results:  Relevant Lab Results:   Additional Information SS#: [id number]  Donnie Coffin, LCSW

## 2015-08-03 NOTE — Discharge Summary (Signed)
Physician Discharge Summary  Albert Bond DOB: 08-06-1950 DOA: 08/01/2015  PCP: Doris Cheadle, MD  Admit date: 08/01/2015 Discharge date: 08/03/2015  Admitted From: homeless- lives in a "camp" Disposition: wants to return to camp  Recommendations for Outpatient Follow-up:  1. Platelets low chronically - follow as outpt  Home Health:    Equipment/Devices:  cane    Discharge Condition:  stable   CODE STATUS:  DNR   Diet recommendation:  Heart healthy low sodium Consultations:  none    Discharge Diagnoses:  Principal Problem:   Syncope Active Problems:   Dehydration   UTI (lower urinary tract infection)   Alcohol abuse   Tobacco use disorder   Thrombocytopenia (HCC)   Pressure ulcer    Subjective: No complaints of cough, dyspnea, dizziness, nausea, diarrhea.   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 Not on any medications. He is homeless and felt dizzy when walking down a hill and subsequently passed out. EMS was called by a passerby. No seizure activity. No loss of control of bowel and bladder He was in the ER on 7/1 for a fall and found to have a left clavicle fracture. He has been having occasional pain in this area.  Hospital Course:  Active Problems: Syncope - likely orthostatic hypotension - hydrated and no longer orthostatic - following in telemetry- no events - explained the need to drink more water multiple times through out hospital stay  Dehydration - resolved with aggressive hydration  UTI (lower urinary tract infection) - post void residuals normal  - culture revealed multiple morphotypes- - repeat UA is negative today - Rocephin>> Vantin x 7 days total  Left clavicular fracture - occurred after a fall earlier this month  - has a sling which he will start wearing again - Tylenol for  pain  Tobacco use disorder - nicotine patch- advised to stop smoking  Alcohol abuse - no alcohol withdrawal- advised to stop drinking completely  Thrombocytopenia chronic / acutely elevated Bili - possibly due to above alcohol abuse - following >> bili improved & platelets stable   Benzodiazepines on UDS - patient states he does not use these- likely due to Ativan given in ER   Homeless - social work assisting with this  Discharge Instructions     Medication List    TAKE these medications   cefpodoxime 200 MG tablet Commonly known as:  VANTIN Take 1 tablet (200 mg total) by mouth 2 (two) times daily.   folic acid 1 MG tablet Commonly known as:  FOLVITE Take 1 tablet (1 mg total) by mouth daily.   multivitamin with minerals Tabs tablet Take 1 tablet by mouth daily.   thiamine 100 MG tablet Take 1 tablet (100 mg total) by mouth daily.       No Known Allergies   Procedures/Studies:    Ct Head Wo Contrast  Result Date: 08/01/2015 CLINICAL DATA:  Status post fall 9 days ago. The patient reports difficulty walking since the fall. Initial encounter. EXAM: CT HEAD WITHOUT CONTRAST CT CERVICAL SPINE WITHOUT CONTRAST TECHNIQUE: Multidetector CT imaging of the head and cervical spine was performed following the standard protocol without intravenous contrast. Multiplanar CT image reconstructions of the cervical spine were also generated. COMPARISON:  Head and cervical spine CT scans 06/30/2015. FINDINGS: CT HEAD FINDINGS There is cortical atrophy and chronic microvascular ischemic change. Remote lacunar infarction right caudate head is  unchanged. No evidence of acute intracranial abnormality including hemorrhage, infarct, mass lesion, mass effect, midline shift or abnormal extra-axial fluid collection is identified. No hydrocephalus or pneumocephalus. The calvarium is intact. Carotid atherosclerosis is noted. Imaged paranasal sinuses and mastoid air cells are clear. CT  CERVICAL SPINE FINDINGS No fracture or malalignment is identified. The patient is status post C3-6 fusion. Hardware in C6 has been removed. No bridging bone is seen across the C3-4 disc interspace consistent with pseudoarthrosis. Lung apices are clear. IMPRESSION: No acute abnormality head or cervical spine. Atrophy and chronic microvascular ischemic change. 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 Electronically Signed   By: Drusilla Kanner M.D.   On: 08/01/2015 15:22  Ct Cervical Spine Wo Contrast  Result Date: 08/01/2015 CLINICAL DATA:  Status post fall 9 days ago. The patient reports difficulty walking since the fall. Initial encounter. EXAM: CT HEAD WITHOUT CONTRAST CT CERVICAL SPINE WITHOUT CONTRAST TECHNIQUE: Multidetector CT imaging of the head and cervical spine was performed following the standard protocol without intravenous contrast. Multiplanar CT image reconstructions of the cervical spine were also generated. COMPARISON:  Head and cervical spine CT scans 06/30/2015. FINDINGS: CT HEAD FINDINGS There is cortical atrophy and chronic microvascular ischemic change. Remote lacunar infarction right caudate head is unchanged. No evidence of acute intracranial abnormality including hemorrhage, infarct, mass lesion, mass effect, midline shift or abnormal extra-axial fluid collection is identified. No hydrocephalus or pneumocephalus. The calvarium is intact. Carotid atherosclerosis is noted. Imaged paranasal sinuses and mastoid air cells are clear. CT CERVICAL SPINE FINDINGS No fracture or malalignment is identified. The patient is status post C3-6 fusion. Hardware in C6 has been removed. No bridging bone is seen across the C3-4 disc interspace consistent with pseudoarthrosis. Lung apices are clear. IMPRESSION: No acute abnormality head or cervical spine. Atrophy and chronic microvascular ischemic change. 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  Electronically Signed   By: Drusilla Kanner M.D.   On: 08/01/2015 15:22  Ct Hip Right Wo Contrast  Result Date: 08/01/2015 CLINICAL DATA:  Larey Seat 9 days ago.  Persistent right hip pain. EXAM: CT OF THE RIGHT HIP WITHOUT CONTRAST TECHNIQUE: Multidetector CT imaging of the right hip was performed according to the standard protocol. Multiplanar CT image reconstructions were also generated. COMPARISON:  Radiograph 08/01/2015 FINDINGS: Advanced right hip joint degenerative changes with joint space narrowing, osteophytic spurring and subchondral cystic change. Os acetabuli noted. No acute hip fracture is identified. No evidence of AVN. The visualized right hemipelvis is intact. No definite pubic rami or lower cervical fracture. Osteochondroma versus remote avulsion fracture at the anterior superior iliac spine. Advanced vascular calcifications for age. IMPRESSION: 1. No acute fracture. 2. Moderate to advanced right hip joint degenerative changes for age. 3. Advanced atherosclerotic calcifications for age. Electronically Signed   By: Rudie Meyer M.D.   On: 08/01/2015 16:25  Dg Shoulder Left  Result Date: 08/01/2015 CLINICAL DATA:  Status post fall 11 days ago.  Left shoulder pain. EXAM: LEFT SHOULDER - 2+ VIEW COMPARISON:  Plain films left shoulder 07/07/2015. FINDINGS: Subacute fracture of the distal left clavicle is seen as on the prior exam. There has been some bony resorption about fracture margins since the prior examination. No new fracture is identified. The acromioclavicular joint is intact. The shoulder is located. Calcified granulomata within the left lung are noted. IMPRESSION: No acute abnormality. Subacute fracture of the distal left clavicle as seen on the prior exam. Electronically  Signed   By: Drusilla Kanner M.D.   On: 08/01/2015 15:11  Dg Shoulder Left  Result Date: 07/07/2015 CLINICAL DATA:  Left shoulder pain after falling today. EXAM: LEFT SHOULDER - 2+ VIEW COMPARISON:  None. FINDINGS:  There probably is a nondisplaced acute fracture of the distal left clavicle, seen only on one view. There is remote healed fracture deformity of the proximal left humerus. Moderate glenohumeral degenerative narrowing and sclerosis. No dislocation. IMPRESSION: Probable distal left clavicle fracture. Calcified granulomatous changes in the left upper lobe incidentally noted. Electronically Signed   By: Ellery Plunk M.D.   On: 07/07/2015 23:52   Dg Hip Unilat With Pelvis 2-3 Views Right  Result Date: 08/01/2015 CLINICAL DATA:  Fall 10 days ago with right hip pain EXAM: DG HIP (WITH OR WITHOUT PELVIS) 2-3V RIGHT COMPARISON:  None. FINDINGS: Frontal pelvis shows no fracture. SI joints and symphysis pubis are unremarkable. No inferior or superior pubic ramus fracture evident. AP and frog-leg lateral views of the right hip show no definite fracture although assessment is limited by positioning. IMPRESSION: No definite right femoral neck fracture although study limited by patient positioning. CT or MRI may prove helpful to further evaluate if there is high clinical suspicion for femoral neck fracture. Electronically Signed   By: Kennith Center M.D.   On: 08/01/2015 15:06      Discharge Exam: Vitals:   08/03/15 0547 08/03/15 1329  BP: 114/74 126/85  Pulse: 66 68  Resp: 18 17  Temp: 98 F (36.7 C) 97.7 F (36.5 C)   Vitals:   08/02/15 2033 08/03/15 0039 08/03/15 0547 08/03/15 1329  BP: 126/80 136/83 114/74 126/85  Pulse: 61 74 66 68  Resp: Temp: 98.1 F (36.7 C) 98.1 F (36.7 C) 98 F (36.7 C) 97.7 F (36.5 C)  TempSrc: Oral Oral Axillary Oral  SpO2: 94% 96% 100% 100%  Weight:      Height:        General: Pt is alert, awake, not in acute distress Cardiovascular: RRR, S1/S2 +, no rubs, no gallops Respiratory: CTA bilaterally, no wheezing, no rhonchi Abdominal: Soft, NT, ND, bowel sounds + Extremities: no edema, no cyanosis    The results of significant diagnostics from  this hospitalization (including imaging, microbiology, ancillary and laboratory) are listed below for reference.     Microbiology: Recent Results (from the past 240 hour(s))  Urine culture     Status: Abnormal   Collection Time: 08/01/15  4:00 PM  Result Value Ref Range Status   Specimen Description URINE, RANDOM  Final   Special Requests NONE  Final   Culture MULTIPLE SPECIES PRESENT, SUGGEST RECOLLECTION (A)  Final   Report Status 08/03/2015 FINAL  Final     Labs: BNP (last 3 results) No results for input(s): BNP in the last 8760 hours. Basic Metabolic Panel:  Recent Labs Lab 08/01/15 1504 08/02/15 0821  NA 142 140  K 4.6 3.4*  CL 103 107  CO2 23 25  GLUCOSE 75 97  BUN 33* 20  CREATININE 1.55* 0.96  CALCIUM 9.7 8.2*   Liver Function Tests:  Recent Labs Lab 08/01/15 1504 08/02/15 0821  AST 28 23  ALT 11* 9*  ALKPHOS 84 66  BILITOT 1.5* 0.7  PROT 8.2* 6.6  ALBUMIN 4.6 3.5   No results for input(s): LIPASE, AMYLASE in the last 168 hours. No results for input(s): AMMONIA in the last 168 hours. CBC:  Recent Labs Lab 08/01/15 1504 08/02/15  0506  WBC 4.1 3.6*  NEUTROABS 2.4  --   HGB 13.2 11.1*  HCT 40.3 33.8*  MCV 89.6 88.7  PLT 78* 66*   Cardiac Enzymes: No results for input(s): CKTOTAL, CKMB, CKMBINDEX, TROPONINI in the last 168 hours. BNP: Invalid input(s): POCBNP CBG: No results for input(s): GLUCAP in the last 168 hours. D-Dimer No results for input(s): DDIMER in the last 72 hours. Hgb A1c No results for input(s): HGBA1C in the last 72 hours. Lipid Profile No results for input(s): CHOL, HDL, LDLCALC, TRIG, CHOLHDL, LDLDIRECT in the last 72 hours. Thyroid function studies No results for input(s): TSH, T4TOTAL, T3FREE, THYROIDAB in the last 72 hours.  Invalid input(s): FREET3 Anemia work up  Recent Labs  08/02/15 1403  VITAMINB12 839  FOLATE 55.5  FERRITIN 377*  TIBC 202*  IRON 69  RETICCTPCT <0.4*   Urinalysis    Component  Value Date/Time   COLORURINE AMBER (A) 08/01/2015 1600   APPEARANCEUR CLOUDY (A) 08/01/2015 1600   LABSPEC 1.028 08/01/2015 1600   PHURINE 5.5 08/01/2015 1600   GLUCOSEU NEGATIVE 08/01/2015 1600   HGBUR NEGATIVE 08/01/2015 1600   BILIRUBINUR LARGE (A) 08/01/2015 1600   KETONESUR 40 (A) 08/01/2015 1600   PROTEINUR 30 (A) 08/01/2015 1600   UROBILINOGEN 0.2 09/01/2013 1404   NITRITE NEGATIVE 08/01/2015 1600   LEUKOCYTESUR MODERATE (A) 08/01/2015 1600   Sepsis Labs Invalid input(s): PROCALCITONIN,  WBC,  LACTICIDVEN Microbiology Recent Results (from the past 240 hour(s))  Urine culture     Status: Abnormal   Collection Time: 08/01/15  4:00 PM  Result Value Ref Range Status   Specimen Description URINE, RANDOM  Final   Special Requests NONE  Final   Culture MULTIPLE SPECIES PRESENT, SUGGEST RECOLLECTION (A)  Final   Report Status 08/03/2015 FINAL  Final     Time coordinating discharge: Over 30 minutes  SIGNED:   Calvert Cantor, MD  [facility] 08/03/2015, 1:37 PM Pager   If 7PM-7AM, please contact night-coverage [email or web address] Password [email or web address]

## 2015-08-03 NOTE — Progress Notes (Signed)
Pt going back to shelter with a cane from [facility]. HH can not go into shelters.

## 2015-09-07 ENCOUNTER — Encounter (HOSPITAL_COMMUNITY): Payer: Self-pay | Admitting: *Deleted

## 2015-09-07 ENCOUNTER — Emergency Department (HOSPITAL_COMMUNITY)
Admission: EM | Admit: 2015-09-07 | Discharge: 2015-09-08 | Disposition: A | Discharge status: Home / Self Care | Payer: Medicaid Other | Attending: Emergency Medicine | Admitting: Emergency Medicine

## 2015-09-07 ENCOUNTER — Emergency Department (HOSPITAL_COMMUNITY): Payer: Medicaid Other

## 2015-09-07 DIAGNOSIS — R42 Dizziness and giddiness: Secondary | ICD-10-CM | POA: Diagnosis not present

## 2015-09-07 DIAGNOSIS — F17210 Nicotine dependence, cigarettes, uncomplicated: Secondary | ICD-10-CM | POA: Insufficient documentation

## 2015-09-07 DIAGNOSIS — Y92480 Sidewalk as the place of occurrence of the external cause: Secondary | ICD-10-CM | POA: Insufficient documentation

## 2015-09-07 DIAGNOSIS — F10120 Alcohol abuse with intoxication, uncomplicated: Secondary | ICD-10-CM | POA: Insufficient documentation

## 2015-09-07 DIAGNOSIS — F1290 Cannabis use, unspecified, uncomplicated: Secondary | ICD-10-CM | POA: Insufficient documentation

## 2015-09-07 DIAGNOSIS — Y9301 Activity, walking, marching and hiking: Secondary | ICD-10-CM | POA: Diagnosis not present

## 2015-09-07 DIAGNOSIS — R55 Syncope and collapse: Secondary | ICD-10-CM | POA: Insufficient documentation

## 2015-09-07 DIAGNOSIS — M25512 Pain in left shoulder: Secondary | ICD-10-CM | POA: Diagnosis present

## 2015-09-07 DIAGNOSIS — W101XXA Fall (on)(from) sidewalk curb, initial encounter: Secondary | ICD-10-CM | POA: Diagnosis not present

## 2015-09-07 DIAGNOSIS — Z79899 Other long term (current) drug therapy: Secondary | ICD-10-CM | POA: Diagnosis not present

## 2015-09-07 DIAGNOSIS — Y999 Unspecified external cause status: Secondary | ICD-10-CM | POA: Insufficient documentation

## 2015-09-07 DIAGNOSIS — F10920 Alcohol use, unspecified with intoxication, uncomplicated: Secondary | ICD-10-CM

## 2015-09-07 LAB — CBC WITH DIFFERENTIAL/PLATELET
BASOS ABS: 0 10*3/uL (ref 0.0–0.1)
BASOS PCT: 0 %
EOS ABS: 0 10*3/uL (ref 0.0–0.7)
Eosinophils Relative: 0 %
HEMATOCRIT: 37.3 % — AB (ref 39.0–52.0)
HEMOGLOBIN: 12.5 g/dL — AB (ref 13.0–17.0)
Lymphocytes Relative: 19 %
Lymphs Abs: 0.9 10*3/uL (ref 0.7–4.0)
MCH: 29.3 pg (ref 26.0–34.0)
MCHC: 33.5 g/dL (ref 30.0–36.0)
MCV: 87.6 fL (ref 78.0–100.0)
MONOS PCT: 7 %
Monocytes Absolute: 0.4 10*3/uL (ref 0.1–1.0)
NEUTROS ABS: 3.5 10*3/uL (ref 1.7–7.7)
NEUTROS PCT: 73 %
Platelets: 115 10*3/uL — ABNORMAL LOW (ref 150–400)
RBC: 4.26 MIL/uL (ref 4.22–5.81)
RDW: 14.9 % (ref 11.5–15.5)
WBC: 4.8 10*3/uL (ref 4.0–10.5)

## 2015-09-07 LAB — BASIC METABOLIC PANEL
ANION GAP: 8 (ref 5–15)
BUN: 15 mg/dL (ref 6–20)
CALCIUM: 9.2 mg/dL (ref 8.9–10.3)
CO2: 27 mmol/L (ref 22–32)
Chloride: 105 mmol/L (ref 101–111)
Creatinine, Ser: 0.77 mg/dL (ref 0.61–1.24)
Glucose, Bld: 96 mg/dL (ref 65–99)
POTASSIUM: 4 mmol/L (ref 3.5–5.1)
SODIUM: 140 mmol/L (ref 135–145)

## 2015-09-07 LAB — ETHANOL: ALCOHOL ETHYL (B): 166 mg/dL — AB (ref <5)

## 2015-09-07 MED ORDER — SODIUM CHLORIDE 0.9 % IV BOLUS (SEPSIS)
1000.0000 mL | Freq: Once | INTRAVENOUS | Status: AC
  Administered 2015-09-07: 1000 mL via INTRAVENOUS

## 2015-09-07 NOTE — ED Notes (Signed)
This Nurse attempted IV access twice, unsuccessful. Called another Nurse for IV access.

## 2015-09-07 NOTE — ED Triage Notes (Signed)
Per [facility], called out for fall at an intersection. Pt states he fell while walking, states his legs gave out. Pt complains of left shoulder pain. Pt has hx of stroke, pt states he has been drinking alcohol all day.

## 2015-09-07 NOTE — ED Notes (Signed)
Patient transported to CT 

## 2015-09-07 NOTE — ED Provider Notes (Signed)
[facility]-EMERGENCY DEPT Provider Note   CSN: [id number] Arrival date & time: 09/07/15  8295     History   Chief Complaint Chief Complaint  Patient presents with  . Fall  . Shoulder Pain  . Alcohol Intoxication    HPI Albert Bond is a 65 y.o. male presenting after passing out. He states he is walking on the sidewalk and acutely felt lightheaded, dizzy, and had tunnel vision. He thinks he was out for a couple minutes. He thinks he hit his head but denies headache. Complaining of left posterior shoulder pain. Currently feels asymptomatic otherwise and has no chest pain, headache, or shortness of breath. States he has drank at least one bottle of wine today.  HPI  Past Medical History:  Diagnosis Date  . Back pain     Patient Active Problem List   Diagnosis Date Noted  . Alcohol abuse 08/03/2015  . Pressure ulcer 08/02/2015  . Dehydration 08/01/2015  . Thrombocytopenia (HCC) 08/01/2015  . UTI (lower urinary tract infection) 08/01/2015  . Tobacco use disorder 09/12/2013  . Left-sided weakness 08/24/2013  . Syncope 08/24/2013  . Anemia 08/24/2013    Past Surgical History:  Procedure Laterality Date  . ANTERIOR CERVICAL DECOMP/DISCECTOMY FUSION N/A 08/30/2013   Procedure: ANTERIOR CERVICAL DECOMPRESSION/DISCECTOMY FUSION 2 LEVELS;  Surgeon: Lisbeth Renshaw, MD;  Location: [facility];  Service: Neurosurgery;  Laterality: N/A;  C34 C45 anterior cervical decompression with fusion interbody prosthesis plating and bonegraft       Home Medications    Prior to Admission medications   Medication Sig Start Date End Date Taking? Authorizing Provider  cefpodoxime (VANTIN) 200 MG tablet Take 1 tablet (200 mg total) by mouth 2 (two) times daily. 08/03/15   Calvert Cantor, MD  folic acid (FOLVITE) 1 MG tablet Take 1 tablet (1 mg total) by mouth daily. 08/02/15   Calvert Cantor, MD  Multiple Vitamin (MULTIVITAMIN WITH MINERALS) TABS tablet Take 1 tablet by mouth daily. 08/02/15    Calvert Cantor, MD  thiamine 100 MG tablet Take 1 tablet (100 mg total) by mouth daily. 08/02/15   Calvert Cantor, MD    Family History Family History  Problem Relation Age of Onset  . CAD Mother   . CAD Father     Social History Social History  Substance Use Topics  . Smoking status: Current Every Day Smoker    Packs/day: 0.50    Years: 40.00    Types: Cigarettes  . Smokeless tobacco: Never Used  . Alcohol use Yes     Comment: Heavily     Allergies   Review of patient's allergies indicates no known allergies.   Review of Systems Review of Systems  Respiratory: Negative for shortness of breath.   Cardiovascular: Negative for chest pain.  Gastrointestinal: Negative for abdominal pain and vomiting.  Musculoskeletal: Positive for arthralgias.  Neurological: Positive for syncope and light-headedness. Negative for headaches.  All other systems reviewed and are negative.    Physical Exam Updated Vital Signs BP 147/66 (BP Location: Right Arm)   Pulse 71   Resp 18   SpO2 97%   Physical Exam  Constitutional: He is oriented to person, place, and time. He appears well-developed and well-nourished. No distress.  Does not seem overtly intoxicated  HENT:  Head: Normocephalic and atraumatic.  Right Ear: External ear normal.  Left Ear: External ear normal.  Nose: Nose normal.  Eyes: Right eye exhibits no discharge. Left eye exhibits no discharge.  Neck: Neck supple.  Cardiovascular:  Normal rate, regular rhythm and normal heart sounds.   Pulses:      Radial pulses are 2+ on the left side.  Pulmonary/Chest: Effort normal and breath sounds normal.  Abdominal: Soft. There is no tenderness.  Musculoskeletal: He exhibits no edema.       Left shoulder: He exhibits tenderness (mild, posterior). He exhibits normal range of motion and no deformity.  Neurological: He is alert and oriented to person, place, and time.  Symmetric face, no facial droop. 5/5 strength in all 4 extremities.    Skin: Skin is warm and dry. He is not diaphoretic.  Nursing note and vitals reviewed.    ED Treatments / Results  Labs (all labs ordered are listed, but only abnormal results are displayed) Labs Reviewed  ETHANOL - Abnormal; Notable for the following:       Result Value   Alcohol, Ethyl (B) 166 (*)    All other components within normal limits  CBC WITH DIFFERENTIAL/PLATELET - Abnormal; Notable for the following:    Hemoglobin 12.5 (*)    HCT 37.3 (*)    Platelets 115 (*)    All other components within normal limits  BASIC METABOLIC PANEL    EKG  EKG Interpretation  Date/Time:  Friday September 07 2015 22:17:57 EDT Ventricular Rate:  67 PR Interval:    QRS Duration: 112 QT Interval:  444 QTC Calculation: 469 R Axis:   25 Text Interpretation:  Sinus rhythm Borderline intraventricular conduction delay Low voltage, extremity leads no significant change since earlier in the day and prior ECGs Confirmed by Franchelle Foskett MD, Arish Redner [phone]([id number]) on 09/07/2015 11:09:31 PM       EKG Interpretation  Date/Time:  Friday September 07 2015 22:17:57 EDT Ventricular Rate:  67 PR Interval:    QRS Duration: 112 QT Interval:  444 QTC Calculation: 469 R Axis:   25 Text Interpretation:  Sinus rhythm Borderline intraventricular conduction delay Low voltage, extremity leads no significant change since earlier in the day and prior ECGs Confirmed by Teigen Bellin MD, Tijuana Scheidegger [phone]) on 09/07/2015 11:09:31 PM        Radiology Ct Head Wo Contrast  Result Date: 09/07/2015 CLINICAL DATA:  65 y/o  M; status post fall wall walking. EXAM: CT HEAD WITHOUT CONTRAST TECHNIQUE: Contiguous axial images were obtained from the base of the skull through the vertex without intravenous contrast. COMPARISON:  08/01/2015 CT head. FINDINGS: Brain: No evidence of acute infarction, hemorrhage, hydrocephalus, extra-axial collection or mass lesion/mass effect. Stable chronic lacunar infarct within right lentiform nucleus extending  into the corona radiata an within right caudate head. Nonspecific stable left posterior lentiform nucleus lucency may represent old lacunar infarct or prominent perivascular space. Mild parenchymal volume loss and chronic microvascular ischemic changes. Vascular: No hyperdense vessel. Extensive calcific atherosclerosis of cavernous internal carotid arteries. Skull: Negative appear Sinuses/Orbits: No acute finding. Other: None. IMPRESSION: 1. No acute intracranial abnormality or skull fracture is identified. 2. Stable mild chronic microvascular ischemic changes, parenchymal volume loss, and chronic lacunar infarcts within the basal ganglia. Electronically Signed   By: Mitzi HansenLance  Furusawa-Stratton M.D.   On: 09/07/2015 22:41   Dg Shoulder Left  Result Date: 09/07/2015 CLINICAL DATA:  65 y/o  M; status post fall with left shoulder pain. EXAM: LEFT SHOULDER - 2+ VIEW COMPARISON:  08/01/2015 left shoulder radiographs. FINDINGS: No acute fracture or dislocation of the shoulder is identified. There is prominent callus of the distal clavicle near the acromioclavicular joint compatible with chronic fracture deformity. Calcified nodules in the  left hilum and upper lung zone are stable. IMPRESSION: 1. No acute fracture or dislocation of the left shoulder is identified. 2. Chronic fracture deformity of left distal clavicle. Electronically Signed   By: Mitzi Hansen M.D.   On: 09/07/2015 20:28    Procedures Procedures (including critical care time)  Medications Ordered in ED Medications  sodium chloride 0.9 % bolus 1,000 mL (1,000 mLs Intravenous New Bag/Given 09/07/15 2124)     Initial Impression / Assessment and Plan / ED Course  I have reviewed the triage vital signs and the nursing notes.  Pertinent labs & imaging results that were available during my care of the patient were reviewed by me and considered in my medical decision making (see chart for details).  Clinical Course  Comment By Time  NV  intact. Syncope probably related to heavy ETOH use. Will ct head given fall and chronic ETOH abuse. Xray left shoulder.  Pricilla Loveless, MD 09/01 2003    Patient's syncope was likely ETOH/dehydration related. No CP, dyspnea or palpitations. No concerning findings on workup. Patient ambulated, well with walker which he chronically uses. Discharge home with return precautions, f/u with a PCP.  Final Clinical Impressions(s) / ED Diagnoses   Final diagnoses:  Alcohol intoxication, uncomplicated (HCC)  Syncope, unspecified syncope type  Left shoulder pain    New Prescriptions New Prescriptions   No medications on file     Pricilla Loveless, MD 09/07/15 2333

## 2015-10-28 ENCOUNTER — Emergency Department (HOSPITAL_COMMUNITY)
Admission: EM | Admit: 2015-10-28 | Discharge: 2015-10-29 | Disposition: A | Discharge status: Home / Self Care | Payer: Medicaid Other | Attending: Emergency Medicine | Admitting: Emergency Medicine

## 2015-10-28 ENCOUNTER — Encounter (HOSPITAL_COMMUNITY): Payer: Self-pay

## 2015-10-28 DIAGNOSIS — W19XXXA Unspecified fall, initial encounter: Secondary | ICD-10-CM | POA: Insufficient documentation

## 2015-10-28 DIAGNOSIS — F17210 Nicotine dependence, cigarettes, uncomplicated: Secondary | ICD-10-CM | POA: Diagnosis not present

## 2015-10-28 DIAGNOSIS — F10120 Alcohol abuse with intoxication, uncomplicated: Secondary | ICD-10-CM | POA: Diagnosis not present

## 2015-10-28 DIAGNOSIS — Y939 Activity, unspecified: Secondary | ICD-10-CM | POA: Diagnosis not present

## 2015-10-28 DIAGNOSIS — Y999 Unspecified external cause status: Secondary | ICD-10-CM | POA: Insufficient documentation

## 2015-10-28 DIAGNOSIS — S0990XA Unspecified injury of head, initial encounter: Secondary | ICD-10-CM | POA: Diagnosis present

## 2015-10-28 DIAGNOSIS — Z79899 Other long term (current) drug therapy: Secondary | ICD-10-CM | POA: Insufficient documentation

## 2015-10-28 DIAGNOSIS — Y929 Unspecified place or not applicable: Secondary | ICD-10-CM | POA: Diagnosis not present

## 2015-10-28 DIAGNOSIS — F10920 Alcohol use, unspecified with intoxication, uncomplicated: Secondary | ICD-10-CM

## 2015-10-28 NOTE — ED Provider Notes (Signed)
Emergency Department Provider Note By signing my name below, I, Emmanuella Mensah, attest that this documentation has been prepared under the direction and in the presence of Maia Plan, MD. Electronically Signed: Angelene Giovanni, ED Scribe. 10/28/15. 11:23 PM.   Maia Plan, MD has reviewed the triage vital signs and the nursing notes.   HISTORY  Chief Complaint Fall and Alcohol Intoxication   HPI Comments: Albert Bond is a 65 y.o. male with a hx of alcohol abuse brought in by ambulance, who presents to the Emergency Department for evaluation s/p alcohol intoxication. Pt explains that he has been drinking ETOH and using marijuana tonight when he fell. He reports LOC. He denies any current pain or that he sustained any injuries during the fall. Per EMS, pt was found in front of [employer]. No alleviating factors noted. Pt has not tried any medications PTA. He has NKDA. He denies any fever, abdominal pain, chest pain, SOB, numbness/tingling in BLE, or any other symptoms.    Past Medical History:  Diagnosis Date  . Back pain     Patient Active Problem List   Diagnosis Date Noted  . Alcohol abuse 08/03/2015  . Pressure ulcer 08/02/2015  . Dehydration 08/01/2015  . Thrombocytopenia (HCC) 08/01/2015  . UTI (lower urinary tract infection) 08/01/2015  . Tobacco use disorder 09/12/2013  . Left-sided weakness 08/24/2013  . Syncope 08/24/2013  . Anemia 08/24/2013    Past Surgical History:  Procedure Laterality Date  . ANTERIOR CERVICAL DECOMP/DISCECTOMY FUSION N/A 08/30/2013   Procedure: ANTERIOR CERVICAL DECOMPRESSION/DISCECTOMY FUSION 2 LEVELS;  Surgeon: Lisbeth Renshaw, MD;  Location: [facility];  Service: Neurosurgery;  Laterality: N/A;  C34 C45 anterior cervical decompression with fusion interbody prosthesis plating and bonegraft    Current Outpatient Rx  . Order #: [id number] Class: Print  . Order #: [id number] Class: Print  . Order #: [id number] Class:  Print  . Order #: [id number] Class: Print    Allergies Review of patient's allergies indicates no known allergies.  Family History  Problem Relation Age of Onset  . CAD Mother   . CAD Father     Social History Social History  Substance Use Topics  . Smoking status: Current Every Day Smoker    Packs/day: 0.50    Years: 40.00    Types: Cigarettes  . Smokeless tobacco: Never Used  . Alcohol use Yes     Comment: Heavily    Review of Systems Constitutional: No fever/chills Eyes: No visual changes. ENT: No sore throat. Cardiovascular: Denies chest pain. Respiratory: Denies shortness of breath. Gastrointestinal: No abdominal pain.  No nausea, no vomiting.  No diarrhea.  No constipation. Genitourinary: Negative for dysuria. Musculoskeletal: Negative for back pain. Skin: Negative for rash. Neurological: Negative for focal weakness or numbness. Positive mild HA.   10-point ROS otherwise negative.  ____________________________________________   PHYSICAL EXAM:  VITAL SIGNS: ED Triage Vitals [10/28/15 2119]  Enc Vitals Group     BP 112/98     Pulse Rate 75     Resp 17     Temp 98 F (36.7 C)     Temp Source Oral     SpO2 95 %   Constitutional: Alert and oriented. Well appearing but moderately intoxicated. Eyes: Conjunctivae are normal. PERRL.  Head: Atraumatic. Nose: No congestion/rhinnorhea. Mouth/Throat: Mucous membranes are dry.  Oropharynx non-erythematous. Neck: No stridor. No cervical spine tenderness to palpation. Cardiovascular: Normal rate, regular rhythm. Good peripheral circulation. Grossly normal heart sounds.   Respiratory: Normal respiratory effort.  No retractions. Lungs CTAB. Gastrointestinal: Soft and nontender. No distention.  Musculoskeletal: No lower extremity tenderness nor edema. No gross deformities of extremities. Neurologic:  Normal speech and language. No gross focal neurologic deficits are appreciated.  Skin:  Skin is warm, dry and intact.  No rash noted.  ____________________________________________  RADIOLOGY  Ct Head Wo Contrast  Result Date: 10/29/2015 CLINICAL DATA:  65 year old male with fall and trauma to the head. EXAM: CT HEAD WITHOUT CONTRAST TECHNIQUE: Contiguous axial images were obtained from the base of the skull through the vertex without intravenous contrast. COMPARISON:  Head CT dated 09/07/2015 FINDINGS: Brain: There is mild age-related atrophy and chronic microvascular ischemic changes. 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 Small focal area of old infarct noted in the right periventricular white matter. Bilateral basal ganglia calcifications noted. There is no acute intracranial hemorrhage. No mass effect or midline shift noted. No extra-axial fluid collections. Vascular: No hyperdense vessel or unexpected calcification. Skull: Normal. Negative for fracture or focal lesion. Sinuses/Orbits: The visualized paranasal sinuses and mastoid air cells are clear. Slight step-off of the left nasal bone, likely chronic. Clinical correlation is recommended. Other: None IMPRESSION: No acute intracranial hemorrhage. Mild age-related atrophy and chronic microvascular ischemic changes. Electronically Signed   By: Elgie CollardArash  Radparvar M.D.   On: 10/29/2015 03:40    ____________________________________________   PROCEDURES  Procedure(s) performed:   Procedures  None ____________________________________________   INITIAL IMPRESSION / ASSESSMENT AND PLAN / ED COURSE  Pertinent labs & imaging results that were available during my care of the patient were reviewed by me and considered in my medical decision making (see chart for details).  Patient is sleeping on my initial exam. He awakens easily to voice. He is homeless. No obvious head or neck trauma at this time. He was found on the ground and notes that he did have a fall with head trauma. Plan for continued sobering in the emergency Department and reassessment when  more sober. Plan for CT head given intoxication and report of head trauma with positive LOC.  06:02 AM Patient is awake and alert. He is eating and ambulatory with walker at bedside. Plan for discharge. Discussed return precautions. CT scan of the head is negative.  At this time, I do not feel there is any life-threatening condition present. I have reviewed and discussed all results (EKG, imaging, lab, urine as appropriate), exam findings with patient. I have reviewed nursing notes and appropriate previous records.  I feel the patient is safe to be discharged home without further emergent workup. Discussed usual and customary return precautions. Patient and family (if present) verbalize understanding and are comfortable with this plan.  Patient will follow-up with their primary care provider. If they do not have a primary care provider, information for follow-up has been provided to them. All questions have been answered.  ____________________________________________  FINAL CLINICAL IMPRESSION(S) / ED DIAGNOSES  Final diagnoses:  Alcoholic intoxication without complication (HCC)     MEDICATIONS GIVEN DURING THIS VISIT:  None  NEW OUTPATIENT MEDICATIONS STARTED DURING THIS VISIT:  None  I personally performed the services described in this documentation, which was scribed in my presence. The recorded information has been reviewed and is accurate.   Note:  This document was prepared using Dragon voice recognition software and may include unintentional dictation errors.  Alona BeneJoshua Long, MD Emergency Medicine    Maia PlanJoshua G Long, MD 10/29/15 [phone]

## 2015-10-28 NOTE — ED Triage Notes (Signed)
Pt brought in by EMS and patient was found on the ground as per EMS in front of [employer]. EMS states that the patient admits to drinking alcohol and mariajuana use. . Pt has a C- collar on arrival to ED.

## 2015-10-28 NOTE — ED Notes (Signed)
Pt is cooperative but is verbally loud and is intoxicated,

## 2015-10-28 NOTE — ED Notes (Signed)
[facility] Expected date:  Expected time:  Means of arrival:  Comments: 65yo M fall/etoh

## 2015-10-29 ENCOUNTER — Emergency Department (HOSPITAL_COMMUNITY): Payer: Medicaid Other

## 2015-10-29 NOTE — Discharge Instructions (Signed)
RESOURCE GUIDE ° °Chronic Pain Problems: °Contact [facility]  [phone] °Patients need to be referred by their primary care doctor. ° °Insufficient Money for Medicine: °Contact [employer]:  call [phone] 892-1162 ° °No Primary Care Doctor: °Call [facility]  [phone] - can help you locate a primary care doctor that  accepts your insurance, provides certain services, etc. °Physician Referral Service- 1-800-533-3463 ° °Agencies that provide inexpensive medical care: °[facility]  [phone] °[facility]  [phone] °[facility]  [phone] °[facility]  [phone] °[facility]  [phone] °[facility]  [phone] ° °Medicaid-accepting [place] Providers: °[facility]- [address] ° [phone], Mon-Fri 9am-7pm, Sat 9am-1pm °[facility]- [address], [address] ° [phone] °[facility]- [address], [address] ° [phone] °[facility]- [address] [place] [address] ° [phone] °[facility]- [address], [phone] ° Only accepts [place] Access Medicaid patients after they have their name  applied to their card ° °Self Pay (no insurance) in [place]: °Sickle Cell Patients - [facility] ° [address], [phone] °[facility]- [address] ° [phone] °      -     [facility]- [address] °      -     [facility]- see information above (Speak to Pam H if you do not have insurance) °      -  [facility] [place]- [address],  [phone] °      -  [facility]- [address] [place] [address], [phone] °      -  [facility]-  [address], [place], [phone] °      -  [facility] - [address], [phone] °      -  [facility]- [address] [place] [address], [phone], also [address]  [address],  [phone] °      -     [facility]- [address], [phone], 1st & 3rd Saturday °        every month, 10am-1pm ° -     [facility] °  [address], [place]. °  Phone:  [phone], Fax:  [phone]. Hours of Operation:  9 am - 6 pm, M-F. ° -     [facility] °  [address], [address], [place] °  Phone: [phone], Fax: [phone]. Hours of Operation:  8:30 am - 5:30 pm, M-F. ° ° ° °Dental Assistance °If unable to pay or uninsured, contact:  [facility] to become qualified for the adult dental clinic. ° °Patients with Medicaid: [facility] °[address], [phone] °[address], [phone] ° °If unable to pay, or uninsured, contact [place] [facility] ([phone] in [place], [phone] in [place]) to become qualified for the adult dental clinic ° °[facility] °[address] °, [place] [address] °[phone] °[email or web address] ° °Other Low-Cost Community Dental Services: °[facility]- [address], [place], [place], [address], [phone], Ext. [phone], 2nd and 4th Thursday of the month at 6:30am.  10 clients each day by appointment, can sometimes see walk-in patients if someone does not show for an appointment. °  [facility]- [address], [place], [place], [address], [phone] °[facility]- [address], [place], [place], [address], [phone] °[facility]- [phone] °[facility]- [phone] °[facility]- [phone]  °

## 2015-11-07 ENCOUNTER — Encounter (HOSPITAL_COMMUNITY): Payer: Self-pay | Admitting: *Deleted

## 2015-11-07 ENCOUNTER — Emergency Department (HOSPITAL_COMMUNITY)
Admission: EM | Admit: 2015-11-07 | Discharge: 2015-11-08 | Disposition: A | Discharge status: Home / Self Care | Payer: Medicaid Other | Attending: Emergency Medicine | Admitting: Emergency Medicine

## 2015-11-07 DIAGNOSIS — F10129 Alcohol abuse with intoxication, unspecified: Secondary | ICD-10-CM | POA: Diagnosis not present

## 2015-11-07 DIAGNOSIS — F17210 Nicotine dependence, cigarettes, uncomplicated: Secondary | ICD-10-CM | POA: Insufficient documentation

## 2015-11-07 DIAGNOSIS — Z79899 Other long term (current) drug therapy: Secondary | ICD-10-CM | POA: Diagnosis not present

## 2015-11-07 DIAGNOSIS — F1290 Cannabis use, unspecified, uncomplicated: Secondary | ICD-10-CM | POA: Insufficient documentation

## 2015-11-07 DIAGNOSIS — F10929 Alcohol use, unspecified with intoxication, unspecified: Secondary | ICD-10-CM

## 2015-11-07 DIAGNOSIS — Y92488 Other paved roadways as the place of occurrence of the external cause: Secondary | ICD-10-CM | POA: Insufficient documentation

## 2015-11-07 DIAGNOSIS — Y999 Unspecified external cause status: Secondary | ICD-10-CM | POA: Diagnosis not present

## 2015-11-07 DIAGNOSIS — W010XXA Fall on same level from slipping, tripping and stumbling without subsequent striking against object, initial encounter: Secondary | ICD-10-CM | POA: Insufficient documentation

## 2015-11-07 DIAGNOSIS — Y9301 Activity, walking, marching and hiking: Secondary | ICD-10-CM | POA: Insufficient documentation

## 2015-11-07 NOTE — ED Notes (Signed)
[facility] Expected date:  Expected time:  Means of arrival:  Comments: EMS 65 yo male found in street from a rolling walker/intoxicated

## 2015-11-07 NOTE — ED Triage Notes (Signed)
Per [facility], pt was in the roadway with his walker, it slipped out from under him and he fell.  C-collar applied.

## 2015-11-08 ENCOUNTER — Encounter (HOSPITAL_COMMUNITY): Payer: Self-pay | Admitting: Emergency Medicine

## 2015-11-08 NOTE — ED Provider Notes (Signed)
[facility]-EMERGENCY DEPT Provider Note   CSN: [id number] Arrival date & time: 11/07/15  2138 By signing my name below, I, Linus Galas, attest that this documentation has been prepared under the direction and in the presence of Shone Leventhal, MD. Electronically Signed: Linus Galas, ED Scribe. 11/08/15. 3:01 AM.  History   Chief Complaint Chief Complaint  Patient presents with  . Alcohol Intoxication   The history is provided by the patient. No language interpreter was used.  Alcohol Intoxication  This is a recurrent problem. The current episode started 6 to 12 hours ago. The problem occurs constantly. The problem has been resolved. Pertinent negatives include no chest pain, no headaches and no shortness of breath. Nothing aggravates the symptoms. Nothing relieves the symptoms. He has tried nothing for the symptoms. The treatment provided no relief.  Fall  This is a new problem. The current episode started 6 to 12 hours ago. Pertinent negatives include no chest pain, no headaches and no shortness of breath. Nothing aggravates the symptoms. Nothing relieves the symptoms. He has tried nothing for the symptoms.   HPI Comments: Albert Bond is a 65 y.o. male who presents to the Emergency Department via EMS complaining of fall s/p alcohol intoxication. Pt states he was intoxicated when he fell and "bumped" his forehead on the concrete. Pt denies any neck pain, back pain, visual changes, or any other symptoms at this time.   Past Medical History:  Diagnosis Date  . Back pain     Patient Active Problem List   Diagnosis Date Noted  . Alcohol abuse 08/03/2015  . Pressure ulcer 08/02/2015  . Dehydration 08/01/2015  . Thrombocytopenia (HCC) 08/01/2015  . UTI (lower urinary tract infection) 08/01/2015  . Tobacco use disorder 09/12/2013  . Left-sided weakness 08/24/2013  . Syncope 08/24/2013  . Anemia 08/24/2013    Past Surgical History:  Procedure Laterality Date  . ANTERIOR  CERVICAL DECOMP/DISCECTOMY FUSION N/A 08/30/2013   Procedure: ANTERIOR CERVICAL DECOMPRESSION/DISCECTOMY FUSION 2 LEVELS;  Surgeon: Lisbeth Renshaw, MD;  Location: [facility];  Service: Neurosurgery;  Laterality: N/A;  C34 C45 anterior cervical decompression with fusion interbody prosthesis plating and bonegraft    Home Medications    Prior to Admission medications   Medication Sig Start Date End Date Taking? Authorizing Provider  cefpodoxime (VANTIN) 200 MG tablet Take 1 tablet (200 mg total) by mouth 2 (two) times daily. Patient not taking: Reported on 10/28/2015 08/03/15   Calvert Cantor, MD  folic acid (FOLVITE) 1 MG tablet Take 1 tablet (1 mg total) by mouth daily. Patient not taking: Reported on 10/28/2015 08/02/15   Calvert Cantor, MD  Multiple Vitamin (MULTIVITAMIN WITH MINERALS) TABS tablet Take 1 tablet by mouth daily. Patient not taking: Reported on 10/28/2015 08/02/15   Calvert Cantor, MD  thiamine 100 MG tablet Take 1 tablet (100 mg total) by mouth daily. Patient not taking: Reported on 10/28/2015 08/02/15   Calvert Cantor, MD    Family History Family History  Problem Relation Age of Onset  . CAD Mother   . CAD Father     Social History Social History  Substance Use Topics  . Smoking status: Current Every Day Smoker    Packs/day: 0.50    Years: 40.00    Types: Cigarettes  . Smokeless tobacco: Never Used  . Alcohol use Yes     Comment: Heavily     Allergies   Review of patient's allergies indicates no known allergies.  Review of Systems Review of Systems  Constitutional: Negative for fever.  Eyes: Negative for visual disturbance.  Respiratory: Negative for shortness of breath.   Cardiovascular: Negative for chest pain.  Musculoskeletal: Negative for back pain and neck pain.  Neurological: Negative for facial asymmetry, light-headedness and headaches.  All other systems reviewed and are negative.  Physical Exam Updated Vital Signs BP 114/75 (BP Location: Left  Arm)   Pulse 72   Temp 98.3 F (36.8 C) (Oral)   Resp 17   SpO2 100%   Physical Exam  Constitutional: He is oriented to person, place, and time. He appears well-developed and well-nourished. No distress.  HENT:  Head: Normocephalic and atraumatic. Head is without raccoon's eyes and without Battle's sign.  Mouth/Throat: Oropharynx is clear and moist. No oropharyngeal exudate.  Moist mucous membranes. Wide spread dental decay.  Eyes: Conjunctivae and EOM are normal. Pupils are equal, round, and reactive to light.  Neck: Normal range of motion. Neck supple. No JVD present.  Trachea midline No bruit  Cardiovascular: Normal rate, regular rhythm, normal heart sounds and intact distal pulses.   Pulmonary/Chest: Effort normal and breath sounds normal. No stridor. No respiratory distress. He has no wheezes. He has no rales.  Abdominal: Soft. Bowel sounds are normal. He exhibits no distension.  Musculoskeletal: Normal range of motion. He exhibits no edema, tenderness or deformity.       Right wrist: Normal.       Left wrist: Normal.       Right knee: Normal.       Left knee: Normal.       Cervical back: Normal.       Thoracic back: Normal.       Lumbar back: Normal.  No stepoff  deformities or crepitus in the C, T, L or S spine. 5/5 strength in upper and lower extremities.   Neurological: He is alert and oriented to person, place, and time. He has normal strength and normal reflexes. No sensory deficit. GCS eye subscore is 4. GCS verbal subscore is 5. GCS motor subscore is 6. He displays no Babinski's sign on the right side.  Reflex Scores:      Tricep reflexes are 2+ on the right side and 2+ on the left side.      Bicep reflexes are 2+ on the right side and 2+ on the left side.      Brachioradialis reflexes are 2+ on the right side and 2+ on the left side.      Patellar reflexes are 2+ on the right side and 2+ on the left side.      Achilles reflexes are 2+ on the right side and 2+ on the  left side. Skin: Skin is warm and dry.  Psychiatric: He has a normal mood and affect. His behavior is normal.  Nursing note and vitals reviewed.  ED Treatments / Results  DIAGNOSTIC STUDIES: Oxygen Saturation is 100% on room air, normal by my interpretation.    COORDINATION OF CARE: 3:01 AM Discussed treatment plan with pt at bedside and pt agreed to plan.   Procedures Procedures (including critical care time)     Final Clinical Impressions(s) / ED Diagnoses  Alcohol intoxication:  Patient is now clinically sober as he slept for many hours in the ED.  No signs of head or neck injury.  Po challenged successfully.  Ambulated about the department.  Stable for discharge at this time.  All questions answered to patient's parents satisfaction. Based on history and exam patient has been appropriately  medically screened and emergency conditions excluded. Patient is stable for discharge at this time. Follow up with your PMD for recheck in 2 days and strict return precautions given.   I personally performed the services described in this documentation, which was scribed in my presence. The recorded information has been reviewed and is accurate.      Cy BlamerApril Ori Trejos, MD 11/08/15 [phone]

## 2015-11-08 NOTE — ED Notes (Signed)
Pt ambulated with walker down hallway without difficulty. Pt eating a Malaw[place]urkey sandwhich and drinking coke at this time.

## 2015-11-08 NOTE — ED Notes (Signed)
Pt stated "I drink 3 or 4 40's a day"

## 2015-12-13 ENCOUNTER — Emergency Department (HOSPITAL_COMMUNITY)
Admission: EM | Admit: 2015-12-13 | Discharge: 2015-12-14 | Disposition: A | Discharge status: Home / Self Care | Payer: Medicaid Other | Attending: Emergency Medicine | Admitting: Emergency Medicine

## 2015-12-13 ENCOUNTER — Emergency Department (HOSPITAL_COMMUNITY): Payer: Medicaid Other

## 2015-12-13 DIAGNOSIS — F10120 Alcohol abuse with intoxication, uncomplicated: Secondary | ICD-10-CM | POA: Diagnosis not present

## 2015-12-13 DIAGNOSIS — F17210 Nicotine dependence, cigarettes, uncomplicated: Secondary | ICD-10-CM | POA: Diagnosis not present

## 2015-12-13 DIAGNOSIS — F1290 Cannabis use, unspecified, uncomplicated: Secondary | ICD-10-CM | POA: Insufficient documentation

## 2015-12-13 DIAGNOSIS — Z79899 Other long term (current) drug therapy: Secondary | ICD-10-CM | POA: Insufficient documentation

## 2015-12-13 DIAGNOSIS — F10920 Alcohol use, unspecified with intoxication, uncomplicated: Secondary | ICD-10-CM

## 2015-12-13 DIAGNOSIS — R4182 Altered mental status, unspecified: Secondary | ICD-10-CM | POA: Diagnosis present

## 2015-12-13 LAB — COMPREHENSIVE METABOLIC PANEL
ALBUMIN: 4.4 g/dL (ref 3.5–5.0)
ALK PHOS: 63 U/L (ref 38–126)
ALT: 18 U/L (ref 17–63)
AST: 32 U/L (ref 15–41)
Anion gap: 12 (ref 5–15)
BILIRUBIN TOTAL: 0.5 mg/dL (ref 0.3–1.2)
BUN: 20 mg/dL (ref 6–20)
CALCIUM: 9.3 mg/dL (ref 8.9–10.3)
CO2: 25 mmol/L (ref 22–32)
Chloride: 104 mmol/L (ref 101–111)
Creatinine, Ser: 0.96 mg/dL (ref 0.61–1.24)
GFR calc Af Amer: >60 mL/min (ref >60)
GFR calc non Af Amer: >60 mL/min (ref >60)
GLUCOSE: 94 mg/dL (ref 65–99)
Potassium: 4 mmol/L (ref 3.5–5.1)
Sodium: 141 mmol/L (ref 135–145)
TOTAL PROTEIN: 8 g/dL (ref 6.5–8.1)

## 2015-12-13 LAB — URINALYSIS, ROUTINE W REFLEX MICROSCOPIC
Bilirubin Urine: NEGATIVE
Glucose, UA: NEGATIVE mg/dL
Hgb urine dipstick: NEGATIVE
Ketones, ur: NEGATIVE mg/dL
Nitrite: NEGATIVE
PROTEIN: 100 mg/dL — AB
Specific Gravity, Urine: 1.026 (ref 1.005–1.030)
pH: 5 (ref 5.0–8.0)

## 2015-12-13 LAB — CBC WITH DIFFERENTIAL/PLATELET
BASOS ABS: 0 10*3/uL (ref 0.0–0.1)
Basophils Relative: 0 %
Eosinophils Absolute: 0 10*3/uL (ref 0.0–0.7)
Eosinophils Relative: 1 %
HEMATOCRIT: 35.2 % — AB (ref 39.0–52.0)
Hemoglobin: 11.5 g/dL — ABNORMAL LOW (ref 13.0–17.0)
LYMPHS ABS: 1.3 10*3/uL (ref 0.7–4.0)
LYMPHS PCT: 22 %
MCH: 29.9 pg (ref 26.0–34.0)
MCHC: 32.7 g/dL (ref 30.0–36.0)
MCV: 91.7 fL (ref 78.0–100.0)
MONO ABS: 0.4 10*3/uL (ref 0.1–1.0)
Monocytes Relative: 7 %
NEUTROS ABS: 4.1 10*3/uL (ref 1.7–7.7)
Neutrophils Relative %: 70 %
Platelets: 143 10*3/uL — ABNORMAL LOW (ref 150–400)
RBC: 3.84 MIL/uL — AB (ref 4.22–5.81)
RDW: 14.5 % (ref 11.5–15.5)
WBC: 5.9 10*3/uL (ref 4.0–10.5)

## 2015-12-13 LAB — RAPID URINE DRUG SCREEN, HOSP PERFORMED
AMPHETAMINES: NOT DETECTED
BENZODIAZEPINES: NOT DETECTED
Barbiturates: NOT DETECTED
Cocaine: NOT DETECTED
OPIATES: NOT DETECTED
Tetrahydrocannabinol: POSITIVE — AB

## 2015-12-13 LAB — LIPASE, BLOOD: Lipase: 29 U/L (ref 11–51)

## 2015-12-13 LAB — ETHANOL: Alcohol, Ethyl (B): 234 mg/dL — ABNORMAL HIGH (ref <5)

## 2015-12-13 LAB — AMMONIA: Ammonia: 15 umol/L (ref 9–35)

## 2015-12-13 MED ORDER — SODIUM CHLORIDE 0.9 % IV SOLN: INTRAVENOUS | Status: DC

## 2015-12-13 MED ORDER — SODIUM CHLORIDE 0.9 % IV BOLUS (SEPSIS)
1000.0000 mL | Freq: Once | INTRAVENOUS | Status: AC
  Administered 2015-12-13: 1000 mL via INTRAVENOUS

## 2015-12-13 NOTE — Discharge Instructions (Signed)
You will need a non- emergent non-contrast chest ct to evaluate pulmonary nodule.  PCP can order this.

## 2015-12-13 NOTE — Progress Notes (Signed)
CSW provided patient with taxi voucher.   Lamoyne Palencia, LCSWA Clinical Social Worker [phone]  

## 2015-12-13 NOTE — ED Notes (Signed)
Per EMS- Pt was found slumped over walker. Altered mental status. Shelter stated that he was probably drunk. Slurring words, pinpoint pupils. Pt now responding and answering questions. Neuro exam unremarkable. Admits to ETOH use.

## 2015-12-13 NOTE — ED Notes (Signed)
Pt needs a walker to ambulate. Social work contacted

## 2015-12-13 NOTE — ED Provider Notes (Signed)
[facility]-EMERGENCY DEPT Provider Note   CSN: [id number] Arrival date & time: 12/13/15  1942     History   Chief Complaint Chief Complaint  Patient presents with  . Altered Mental Status    HPI Albert Bond is a 65 y.o. male.  Pt presents to the ED via EMS due to altered mental status.  Pt lives at a homeless shelter and was found slumped over his walker.  He tells me that he had a lot to drink.  Pt denies any pain.      Past Medical History:  Diagnosis Date  . Back pain     Patient Active Problem List   Diagnosis Date Noted  . Alcohol abuse 08/03/2015  . Pressure ulcer 08/02/2015  . Dehydration 08/01/2015  . Thrombocytopenia (HCC) 08/01/2015  . UTI (lower urinary tract infection) 08/01/2015  . Tobacco use disorder 09/12/2013  . Left-sided weakness 08/24/2013  . Syncope 08/24/2013  . Anemia 08/24/2013    Past Surgical History:  Procedure Laterality Date  . ANTERIOR CERVICAL DECOMP/DISCECTOMY FUSION N/A 08/30/2013   Procedure: ANTERIOR CERVICAL DECOMPRESSION/DISCECTOMY FUSION 2 LEVELS;  Surgeon: Lisbeth RenshawNeelesh Nundkumar, MD;  Location: [facility];  Service: Neurosurgery;  Laterality: N/A;  C34 C45 anterior cervical decompression with fusion interbody prosthesis plating and bonegraft       Home Medications    Prior to Admission medications   Medication Sig Start Date End Date Taking? Authorizing Provider  cefpodoxime (VANTIN) 200 MG tablet Take 1 tablet (200 mg total) by mouth 2 (two) times daily. Patient not taking: Reported on 12/13/2015 08/03/15   Calvert CantorSaima Rizwan, MD  folic acid (FOLVITE) 1 MG tablet Take 1 tablet (1 mg total) by mouth daily. Patient not taking: Reported on 12/13/2015 08/02/15   Calvert CantorSaima Rizwan, MD  Multiple Vitamin (MULTIVITAMIN WITH MINERALS) TABS tablet Take 1 tablet by mouth daily. Patient not taking: Reported on 12/13/2015 08/02/15   Calvert CantorSaima Rizwan, MD  thiamine 100 MG tablet Take 1 tablet (100 mg total) by mouth daily. Patient not taking: Reported  on 12/13/2015 08/02/15   Calvert CantorSaima Rizwan, MD    Family History Family History  Problem Relation Age of Onset  . CAD Mother   . CAD Father     Social History Social History  Substance Use Topics  . Smoking status: Current Every Day Smoker    Packs/day: 0.50    Years: 40.00    Types: Cigarettes  . Smokeless tobacco: Never Used  . Alcohol use Yes     Comment: Heavily     Allergies   Patient has no known allergies.   Review of Systems Review of Systems  All other systems reviewed and are negative.    Physical Exam Updated Vital Signs BP 113/76   Pulse 70   Temp 97.5 F (36.4 C) (Oral)   Resp 17   SpO2 97%   Physical Exam  Constitutional: He is oriented to person, place, and time. He appears cachectic.  Pt is disheveled and smells of alcohol.  HENT:  Head: Normocephalic and atraumatic.  Right Ear: External ear normal.  Left Ear: External ear normal.  Nose: Nose normal.  Mouth/Throat: Oropharynx is clear and moist.  Eyes: Conjunctivae and EOM are normal. Pupils are equal, round, and reactive to light.  Neck: Normal range of motion. Neck supple.  Cardiovascular: Normal rate, regular rhythm, normal heart sounds and intact distal pulses.   Pulmonary/Chest: Effort normal and breath sounds normal.  Abdominal: Soft. Bowel sounds are normal.  Musculoskeletal: Normal range  of motion.  Neurological: He is alert and oriented to person, place, and time.  Skin: Skin is warm.  Psychiatric: He has a normal mood and affect. His behavior is normal. Thought content normal. His speech is slurred. He expresses inappropriate judgment. He exhibits abnormal recent memory.  Nursing note and vitals reviewed.    ED Treatments / Results  Labs (all labs ordered are listed, but only abnormal results are displayed) Labs Reviewed  CBC WITH DIFFERENTIAL/PLATELET - Abnormal; Notable for the following:       Result Value   RBC 3.84 (*)    Hemoglobin 11.5 (*)    HCT 35.2 (*)    Platelets  143 (*)    All other components within normal limits  ETHANOL - Abnormal; Notable for the following:    Alcohol, Ethyl (B) 234 (*)    All other components within normal limits  URINALYSIS, ROUTINE W REFLEX MICROSCOPIC - Abnormal; Notable for the following:    APPearance HAZY (*)    Protein, ur 100 (*)    Leukocytes, UA TRACE (*)    Bacteria, UA RARE (*)    Squamous Epithelial / LPF 0-5 (*)    All other components within normal limits  RAPID URINE DRUG SCREEN, HOSP PERFORMED - Abnormal; Notable for the following:    Tetrahydrocannabinol POSITIVE (*)    All other components within normal limits  AMMONIA  COMPREHENSIVE METABOLIC PANEL  LIPASE, BLOOD    EKG  EKG Interpretation  Date/Time:  Thursday December 13 2015 21:01:54 EST Ventricular Rate:  67 PR Interval:    QRS Duration: 92 QT Interval:  415 QTC Calculation: 439 R Axis:   54 Text Interpretation:  Sinus rhythm Short PR interval Probable left atrial enlargement Abnormal R-wave progression, early transition ST elevation, consider inferior injury pt movement Confirmed by [facility]IL[facility] MD, Ronrico Dupin ([id number]) on 12/13/2015 9:48:04 PM       Radiology Dg Chest 2 View  Result Date: 12/13/2015 CLINICAL DATA:  Mental status change.  Fall. EXAM: CHEST  2 VIEW COMPARISON:  None. FINDINGS: The cardiomediastinal contours are normal. There is an 11 mm nodular density projecting over the left anterior second rib in the upper lung. Pulmonary vasculature is normal. No consolidation, pleural effusion, or pneumothorax. No acute osseous abnormalities are seen. IMPRESSION: 1. No acute abnormality. 2. An 11 mm nodular density projects over the left anterior second rib in the upper lung, unclear whether this reflects a bone island or pulmonary nodule. Recommend nonemergent chest CT for characterization. Electronically Signed   By: Rubye OaksMelanie  Ehinger M.D.   On: 12/13/2015 21:38   Ct Head Wo Contrast  Result Date: 12/13/2015 CLINICAL DATA:  Mental status  change. EXAM: CT HEAD WITHOUT CONTRAST TECHNIQUE: Contiguous axial images were obtained from the base of the skull through the vertex without intravenous contrast. COMPARISON:  Multiple prior exams most recently 10/29/2015 FINDINGS: Brain: No evidence of acute infarction, hemorrhage, hydrocephalus, extra-axial collection or mass lesion/mass effect. Stable atrophy and chronic small vessel ischemia. Stable lacunar infarcts in the basal ganglia. Unchanged basal gangliar calcifications. Vascular: Atherosclerosis of skullbase vasculature without hyperdense vessel or abnormal calcification. Skull: Normal. Negative for fracture or focal lesion. Sinuses/Orbits: Paranasal sinuses and mastoid air cells are clear. The visualized orbits are unremarkable. Other: None IMPRESSION: No acute intracranial abnormality.  Stable chronic change. Electronically Signed   By: Rubye OaksMelanie  Ehinger M.D.   On: 12/13/2015 21:27    Procedures Procedures (including critical care time)  Medications Ordered in ED Medications  sodium chloride  0.9 % bolus 1,000 mL (1,000 mLs Intravenous New Bag/Given 12/13/15 2103)    And  0.9 %  sodium chloride infusion (not administered)     Initial Impression / Assessment and Plan / ED Course  I have reviewed the triage vital signs and the nursing notes.  Pertinent labs & imaging results that were available during my care of the patient were reviewed by me and considered in my medical decision making (see chart for details).  Clinical Course     Pt has been awake and alert since he's been here.  He is eating a sandwich and nonalcoholic beverage.  He was likely slumped over due to intoxication.  No need for admission.  He knows to return if worse.  CXR shows lung nodule.  Pt to be told that he needs an outpatient, nonemergent, noncontrast CT.  Final Clinical Impressions(s) / ED Diagnoses   Final diagnoses:  Alcoholic intoxication without complication (HCC)  Marijuana use    New  Prescriptions New Prescriptions   No medications on file     Jacalyn Lefevre, MD 12/13/15 2150

## 2015-12-13 NOTE — ED Notes (Signed)
Pt stated unable to give urine sample at this time, urinal and call light at bedside. Pt also unable to sit still for EKG, will try again in 10 minutes

## 2015-12-13 NOTE — ED Notes (Signed)
Bed: [id number] Expected date:  Expected time:  Means of arrival:  Comments: EMS altered mental status from shelter

## 2016-05-05 ENCOUNTER — Emergency Department (HOSPITAL_COMMUNITY)
Admission: EM | Admit: 2016-05-05 | Discharge: 2016-05-06 | Disposition: A | Discharge status: Home / Self Care | Payer: Medicaid Other | Attending: Emergency Medicine | Admitting: Emergency Medicine

## 2016-05-05 ENCOUNTER — Encounter (HOSPITAL_COMMUNITY): Payer: Self-pay | Admitting: Emergency Medicine

## 2016-05-05 DIAGNOSIS — F10120 Alcohol abuse with intoxication, uncomplicated: Secondary | ICD-10-CM | POA: Diagnosis present

## 2016-05-05 DIAGNOSIS — F17210 Nicotine dependence, cigarettes, uncomplicated: Secondary | ICD-10-CM | POA: Diagnosis not present

## 2016-05-05 DIAGNOSIS — M25562 Pain in left knee: Secondary | ICD-10-CM | POA: Diagnosis not present

## 2016-05-05 DIAGNOSIS — Z79899 Other long term (current) drug therapy: Secondary | ICD-10-CM | POA: Insufficient documentation

## 2016-05-05 DIAGNOSIS — F10920 Alcohol use, unspecified with intoxication, uncomplicated: Secondary | ICD-10-CM

## 2016-05-05 HISTORY — DX: Homelessness unspecified: Z59.00

## 2016-05-05 HISTORY — DX: Homelessness: Z59.0

## 2016-05-05 HISTORY — DX: Alcohol abuse, uncomplicated: F10.10

## 2016-05-05 NOTE — ED Triage Notes (Addendum)
Pt comes from [facility] via EMS after tripping and falling into the street.  Pt is obviously intoxicated.  Pt states he drank "a lot".  Pt cooperative but vulgar. BP 162/83, HR 78, CBG 112 in route.

## 2016-05-05 NOTE — ED Notes (Signed)
Bed: [facility] Expected date:  Expected time:  Means of arrival:  Comments: EMS intoxicated male

## 2016-05-05 NOTE — ED Provider Notes (Signed)
[facility]-EMERGENCY DEPT Provider Note   CSN: [id number] Arrival date & time: 05/05/16  2341  By signing my name below, I, Albert Bond, attest that this documentation has been prepared under the direction and in the presence of Geoffery Lyons, MD. Electronically Signed: Diona Bond, ED Scribe. 05/05/16. 11:55 PM.  History   Chief Complaint Chief Complaint  Patient presents with  . Alcohol Intoxication    HPI Albert Bond is a 66 y.o. male who presents to the Emergency Department complaining of left knee pain that stared earlier tonight (05/05/16). Pt reports he had been walking when his left knee gave out causing him to fall and hit his head. Denies LOC or head injury. Pt drank ~ a pint of vodka this evening. Pt denies neck pain, HA, dizziness, emesis or any other sx at this time.  The history is provided by the patient. No language interpreter was used.  Alcohol Intoxication  This is a new problem. The current episode started 3 to 5 hours ago. The problem occurs rarely. The problem has not changed since onset.Pertinent negatives include no headaches. He has tried nothing for the symptoms.    Past Medical History:  Diagnosis Date  . Alcohol abuse   . Back pain   . Homelessness     Patient Active Problem List   Diagnosis Date Noted  . Alcohol abuse 08/03/2015  . Pressure ulcer 08/02/2015  . Dehydration 08/01/2015  . Thrombocytopenia (HCC) 08/01/2015  . UTI (lower urinary tract infection) 08/01/2015  . Tobacco use disorder 09/12/2013  . Left-sided weakness 08/24/2013  . Syncope 08/24/2013  . Anemia 08/24/2013    Past Surgical History:  Procedure Laterality Date  . ANTERIOR CERVICAL DECOMP/DISCECTOMY FUSION N/A 08/30/2013   Procedure: ANTERIOR CERVICAL DECOMPRESSION/DISCECTOMY FUSION 2 LEVELS;  Surgeon: Lisbeth Renshaw, MD;  Location: [facility];  Service: Neurosurgery;  Laterality: N/A;  C34 C45 anterior cervical decompression with fusion interbody prosthesis  plating and bonegraft       Home Medications    Prior to Admission medications   Medication Sig Start Date End Date Taking? Authorizing Provider  cefpodoxime (VANTIN) 200 MG tablet Take 1 tablet (200 mg total) by mouth 2 (two) times daily. Patient not taking: Reported on 12/13/2015 08/03/15   Calvert Cantor, MD  folic acid (FOLVITE) 1 MG tablet Take 1 tablet (1 mg total) by mouth daily. Patient not taking: Reported on 12/13/2015 08/02/15   Calvert Cantor, MD  Multiple Vitamin (MULTIVITAMIN WITH MINERALS) TABS tablet Take 1 tablet by mouth daily. Patient not taking: Reported on 12/13/2015 08/02/15   Calvert Cantor, MD  thiamine 100 MG tablet Take 1 tablet (100 mg total) by mouth daily. Patient not taking: Reported on 12/13/2015 08/02/15   Calvert Cantor, MD    Family History Family History  Problem Relation Age of Onset  . CAD Mother   . CAD Father     Social History Social History  Substance Use Topics  . Smoking status: Current Every Day Smoker    Packs/day: 0.50    Years: 40.00    Types: Cigarettes  . Smokeless tobacco: Never Used  . Alcohol use Yes     Comment: Heavily     Allergies   Patient has no known allergies.   Review of Systems Review of Systems  Gastrointestinal: Negative for vomiting.  Musculoskeletal: Negative for neck pain.       L knee pain.  Neurological: Negative for dizziness and headaches.  All other systems reviewed and are negative.  Physical Exam Updated Vital Signs There were no vitals taken for this visit.  Physical Exam  Constitutional: He is oriented to person, place, and time. He appears well-developed and well-nourished. No distress.  Speech is slurred and strong odor of ETOH present.  HENT:  Head: Normocephalic and atraumatic.  Right Ear: Hearing normal.  Left Ear: Hearing normal.  Nose: Nose normal.  Mouth/Throat: Oropharynx is clear and moist and mucous membranes are normal.  Eyes: Conjunctivae and EOM are normal. Pupils are equal,  round, and reactive to light.  Neck: Normal range of motion. Neck supple.  Cardiovascular: Regular rhythm, S1 normal and S2 normal.  Exam reveals no gallop and no friction rub.   No murmur heard. Pulmonary/Chest: Effort normal and breath sounds normal. No respiratory distress. He exhibits no tenderness.  Abdominal: Soft. Normal appearance and bowel sounds are normal. There is no hepatosplenomegaly. There is no tenderness. There is no rebound, no guarding, no tenderness at McBurney's point and negative Murphy's sign. No hernia.  Musculoskeletal: Normal range of motion.  Neurological: He is alert and oriented to person, place, and time. He has normal strength. No cranial nerve deficit or sensory deficit. Coordination normal. GCS eye subscore is 4. GCS verbal subscore is 5. GCS motor subscore is 6.  Skin: Skin is warm, dry and intact. No rash noted. No cyanosis.  Psychiatric: He has a normal mood and affect. His speech is normal and behavior is normal. Thought content normal.  Nursing note and vitals reviewed.    ED Treatments / Results  DIAGNOSTIC STUDIES: Oxygen Saturation is 100% on RA, normal by my interpretation.   COORDINATION OF CARE: 11:50 PM-Discussed next steps with pt. Pt verbalized understanding and is agreeable with the plan.    Labs (all labs ordered are listed, but only abnormal results are displayed) Labs Reviewed  CBC WITH DIFFERENTIAL/PLATELET - Abnormal; Notable for the following:       Result Value   WBC 3.0 (*)    Hemoglobin 12.3 (*)    HCT 38.2 (*)    Platelets 129 (*)    Neutro Abs 1.2 (*)    All other components within normal limits  ETHANOL - Abnormal; Notable for the following:    Alcohol, Ethyl (B) 201 (*)    All other components within normal limits  BASIC METABOLIC PANEL    EKG  EKG Interpretation None       Radiology Dg Knee Complete 4 Views Left  Result Date: 05/06/2016 CLINICAL DATA:  Initial evaluation for acute fall, trauma. EXAM: LEFT  KNEE - COMPLETE 4+ VIEW COMPARISON:  Prior radiograph from 04/12/2014. FINDINGS: No acute fracture or dislocation. No joint effusion. Advanced tricompartmental degenerative osteoarthrosis. No acute soft tissue abnormality. Prominent atherosclerosis noted within the visualized leg. IMPRESSION: 1. No acute osseous abnormality about the left knee. 2. Advanced tricompartmental degenerative osteoarthrosis. Electronically Signed   By: Rise Mu M.D.   On: 05/06/2016 00:49    Procedures Procedures (including critical care time)  Medications Ordered in ED Medications - No data to display   Initial Impression / Assessment and Plan / ED Course  I have reviewed the triage vital signs and the nursing notes.  Pertinent labs & imaging results that were available during my care of the patient were reviewed by me and considered in my medical decision making (see chart for details).  Patient presents with complaints of alcohol intoxication/his knee "giving out". He was observed in the emergency department overnight. Initial blood alcohol was 201 and  laboratory studies are otherwise unremarkable. X-rays of his knee are negative. He will be discharged, to follow-up as needed.  Final Clinical Impressions(s) / ED Diagnoses   Final diagnoses:  None    New Prescriptions New Prescriptions   No medications on file   I personally performed the services described in this documentation, which was scribed in my presence. The recorded information has been reviewed and is accurate.       Geoffery Lyons, MD 05/06/16 [phone]

## 2016-05-06 ENCOUNTER — Emergency Department (HOSPITAL_COMMUNITY): Payer: Medicaid Other

## 2016-05-06 LAB — CBC WITH DIFFERENTIAL/PLATELET
BASOS ABS: 0 10*3/uL (ref 0.0–0.1)
BASOS PCT: 0 %
Eosinophils Absolute: 0 10*3/uL (ref 0.0–0.7)
Eosinophils Relative: 1 %
HEMATOCRIT: 38.2 % — AB (ref 39.0–52.0)
HEMOGLOBIN: 12.3 g/dL — AB (ref 13.0–17.0)
LYMPHS PCT: 49 %
Lymphs Abs: 1.5 10*3/uL (ref 0.7–4.0)
MCH: 29 pg (ref 26.0–34.0)
MCHC: 32.2 g/dL (ref 30.0–36.0)
MCV: 90.1 fL (ref 78.0–100.0)
MONOS PCT: 11 %
Monocytes Absolute: 0.3 10*3/uL (ref 0.1–1.0)
NEUTROS ABS: 1.2 10*3/uL — AB (ref 1.7–7.7)
NEUTROS PCT: 39 %
Platelets: 129 10*3/uL — ABNORMAL LOW (ref 150–400)
RBC: 4.24 MIL/uL (ref 4.22–5.81)
RDW: 14.4 % (ref 11.5–15.5)
WBC: 3 10*3/uL — ABNORMAL LOW (ref 4.0–10.5)

## 2016-05-06 LAB — BASIC METABOLIC PANEL WITH GFR
Anion gap: 12 (ref 5–15)
BUN: 14 mg/dL (ref 6–20)
CO2: 23 mmol/L (ref 22–32)
Calcium: 9.4 mg/dL (ref 8.9–10.3)
Chloride: 105 mmol/L (ref 101–111)
Creatinine, Ser: 0.82 mg/dL (ref 0.61–1.24)
GFR calc Af Amer: >60 mL/min
GFR calc non Af Amer: >60 mL/min
Glucose, Bld: 90 mg/dL (ref 65–99)
Potassium: 4.2 mmol/L (ref 3.5–5.1)
Sodium: 140 mmol/L (ref 135–145)

## 2016-05-06 LAB — ETHANOL: Alcohol, Ethyl (B): 201 mg/dL — ABNORMAL HIGH (ref <5)

## 2016-05-06 NOTE — ED Notes (Signed)
Patient is alert and oriented x3.  He was given DC instructions and follow up visit instructions.  Patient gave verbal understanding.  He was DC ambulatory under his own power to home.  V/S stable.  He was not showing any signs of distress on DC 

## 2016-05-06 NOTE — Discharge Instructions (Signed)
Follow-up with outpatient drug and alcohol treatment if you so desire.

## 2016-08-12 ENCOUNTER — Emergency Department (HOSPITAL_COMMUNITY)
Admission: EM | Admit: 2016-08-12 | Discharge: 2016-08-13 | Disposition: A | Discharge status: Home / Self Care | Payer: Medicaid Other | Attending: Emergency Medicine | Admitting: Emergency Medicine

## 2016-08-12 ENCOUNTER — Encounter (HOSPITAL_COMMUNITY): Payer: Self-pay | Admitting: Emergency Medicine

## 2016-08-12 DIAGNOSIS — G8929 Other chronic pain: Secondary | ICD-10-CM | POA: Insufficient documentation

## 2016-08-12 DIAGNOSIS — F10920 Alcohol use, unspecified with intoxication, uncomplicated: Secondary | ICD-10-CM | POA: Insufficient documentation

## 2016-08-12 DIAGNOSIS — Z590 Homelessness: Secondary | ICD-10-CM | POA: Insufficient documentation

## 2016-08-12 DIAGNOSIS — M25521 Pain in right elbow: Secondary | ICD-10-CM | POA: Insufficient documentation

## 2016-08-12 DIAGNOSIS — F17210 Nicotine dependence, cigarettes, uncomplicated: Secondary | ICD-10-CM | POA: Insufficient documentation

## 2016-08-12 NOTE — ED Provider Notes (Signed)
[facility]-EMERGENCY DEPT Provider Note   CSN: [id number] Arrival date & time: 08/12/16  2128     History   Chief Complaint Chief [id number]  Patient presents with  . Alcohol Intoxication    HPI Albert Bond is a 66 y.o. male.  HPI 66 year old homeless male with history of chronic alcohol abuse here with acute location. Patient reportedly called EMS from the shelter because he had right elbow pain. On my assessment, the patient is intoxicated but states that his right elbow "always hurts" and that "nothing is wrong with it." He declines any imaging at this time. He is notably intoxicated and states that he has been drinking "all day." He is unable to walk at this time due to his intoxication. He denies any other complaints. He states they would not let him in to the shelter.  Level 5 caveat invoked as remainder of history, ROS, and physical exam limited due to patient's intoxication.   Past Medical History:  Diagnosis Date  . Alcohol abuse   . Back pain   . Homelessness     Patient Active Problem List   Diagnosis Date Noted  . Alcohol abuse 08/03/2015  . Pressure ulcer 08/02/2015  . Dehydration 08/01/2015  . Thrombocytopenia (HCC) 08/01/2015  . UTI (lower urinary tract infection) 08/01/2015  . Tobacco use disorder 09/12/2013  . Left-sided weakness 08/24/2013  . Syncope 08/24/2013  . Anemia 08/24/2013    Past Surgical History:  Procedure Laterality Date  . ANTERIOR CERVICAL DECOMP/DISCECTOMY FUSION N/A 08/30/2013   Procedure: ANTERIOR CERVICAL DECOMPRESSION/DISCECTOMY FUSION 2 LEVELS;  Surgeon: Lisbeth RenshawNeelesh Nundkumar, MD;  Location: [facility];  Service: Neurosurgery;  Laterality: N/A;  C34 C45 anterior cervical decompression with fusion interbody prosthesis plating and bonegraft       Home Medications    Prior to Admission medications   Medication Sig Start Date End Date Taking? Authorizing Provider  cefpodoxime (VANTIN) 200 MG tablet Take 1 tablet (200 mg total)  by mouth 2 (two) times daily. Patient not taking: Reported on 12/13/2015 08/03/15   Calvert Cantorizwan, Saima, MD  folic acid (FOLVITE) 1 MG tablet Take 1 tablet (1 mg total) by mouth daily. Patient not taking: Reported on 12/13/2015 08/02/15   Calvert Cantorizwan, Saima, MD  Multiple Vitamin (MULTIVITAMIN WITH MINERALS) TABS tablet Take 1 tablet by mouth daily. Patient not taking: Reported on 12/13/2015 08/02/15   Calvert Cantorizwan, Saima, MD  thiamine 100 MG tablet Take 1 tablet (100 mg total) by mouth daily. Patient not taking: Reported on 12/13/2015 08/02/15   Calvert Cantorizwan, Saima, MD    Family History Family History  Problem Relation Age of Onset  . CAD Mother   . CAD Father     Social History Social History  Substance Use Topics  . Smoking status: Current Every Day Smoker    Packs/day: 0.50    Years: 40.00    Types: Cigarettes  . Smokeless tobacco: Never Used  . Alcohol use Yes     Comment: Heavily     Allergies   Patient has no known allergies.   Review of Systems Review of Systems  Unable to perform ROS: Mental status change     Physical Exam Updated Vital Signs BP 110/80 (BP Location: Left Arm)   Pulse 80   Temp 98.1 F (36.7 C) (Oral)   Resp 18   SpO2 97%   Physical Exam  Constitutional: He appears well-developed and well-nourished. No distress.  Smells of alcohol  HENT:  Head: Normocephalic and atraumatic.  Eyes: Conjunctivae are  normal.  Neck: Neck supple.  Cardiovascular: Normal rate, regular rhythm and normal heart sounds.  Exam reveals no friction rub.   No murmur heard. Pulmonary/Chest: Effort normal and breath sounds normal. No respiratory distress. He has no wheezes. He has no rales.  Abdominal: He exhibits no distension.  Musculoskeletal: He exhibits no edema.  Neurological: He exhibits normal muscle tone.  Alert with slurred speech, ataxic gait. Gaze is conjugate. Face is symmetric. MAE with 5/5 strength. Able to stand without assistance.  Skin: Skin is warm. Capillary refill takes  less than 2 seconds.  Psychiatric: He has a normal mood and affect.  Nursing note and vitals reviewed.    ED Treatments / Results  Labs (all labs ordered are listed, but only abnormal results are displayed) Labs Reviewed - No data to display  EKG  EKG Interpretation None       Radiology No results found.  Procedures Procedures (including critical care time)  Medications Ordered in ED Medications - No data to display   Initial Impression / Assessment and Plan / ED Course  I have reviewed the triage vital signs and the nursing notes.  Pertinent labs & imaging results that were available during my care of the patient were reviewed by me and considered in my medical decision making (see chart for details).     66 yo M with h/o chronic pain, alcoholism here with EtOH intoxication. He initially c/o right elbow pain but on my exam, admits this is chronic and states "it's fine." His elbow is non-tender without swelling or signs of trauma. Do not feel imaging indicated. Will monitor for sobriety and re-assess.   Final Clinical Impressions(s) / ED Diagnoses   Final diagnoses:  Alcoholic intoxication without complication [facility])    New Prescriptions New Prescriptions   No medications on file     Shaune Pollack, MD 08/13/16 1125

## 2016-08-12 NOTE — ED Triage Notes (Signed)
Per EMS, patient from homeless shelter, c/o right elbow pain. Unable to state how long it has been hurting. Patient admits to drinking alcohol today. Denies chest pain and SOB. A&Ox4.  BP 106/70 HR 80 RR 18 CBG 98 O2 96%

## 2016-08-13 NOTE — ED Notes (Signed)
Pt reports he will drink coffee in lobby and then, head to bus stop.

## 2016-08-13 NOTE — ED Notes (Signed)
Pt walked to restroom w/ walker.  Steady gait noted.  Pt given ham sandwich, coffee, and a bus pass.  Pt reports he is going back to the homeless shelter.

## 2016-08-13 NOTE — ED Provider Notes (Signed)
Patient observed overnight and has done well. This morning he is awake, alert and oriented. He is without complaints. No homicidality or suicidality. Will discharge.   Albert Bond, Christopher J, MD 08/13/16 [phone]

## 2016-09-24 ENCOUNTER — Encounter (HOSPITAL_COMMUNITY): Payer: Self-pay | Admitting: *Deleted

## 2016-09-24 ENCOUNTER — Emergency Department (HOSPITAL_COMMUNITY)
Admission: EM | Admit: 2016-09-24 | Discharge: 2016-09-25 | Disposition: A | Discharge status: Home / Self Care | Payer: Self-pay | Attending: Emergency Medicine | Admitting: Emergency Medicine

## 2016-09-24 ENCOUNTER — Emergency Department (HOSPITAL_COMMUNITY): Payer: Self-pay

## 2016-09-24 DIAGNOSIS — Z7982 Long term (current) use of aspirin: Secondary | ICD-10-CM | POA: Insufficient documentation

## 2016-09-24 DIAGNOSIS — F10220 Alcohol dependence with intoxication, uncomplicated: Secondary | ICD-10-CM | POA: Insufficient documentation

## 2016-09-24 DIAGNOSIS — S0990XA Unspecified injury of head, initial encounter: Secondary | ICD-10-CM | POA: Insufficient documentation

## 2016-09-24 DIAGNOSIS — F10920 Alcohol use, unspecified with intoxication, uncomplicated: Secondary | ICD-10-CM

## 2016-09-24 DIAGNOSIS — Y939 Activity, unspecified: Secondary | ICD-10-CM | POA: Insufficient documentation

## 2016-09-24 DIAGNOSIS — Y9289 Other specified places as the place of occurrence of the external cause: Secondary | ICD-10-CM | POA: Insufficient documentation

## 2016-09-24 DIAGNOSIS — Y998 Other external cause status: Secondary | ICD-10-CM | POA: Insufficient documentation

## 2016-09-24 DIAGNOSIS — Z590 Homelessness: Secondary | ICD-10-CM | POA: Insufficient documentation

## 2016-09-24 DIAGNOSIS — W19XXXA Unspecified fall, initial encounter: Secondary | ICD-10-CM | POA: Insufficient documentation

## 2016-09-24 DIAGNOSIS — F17210 Nicotine dependence, cigarettes, uncomplicated: Secondary | ICD-10-CM | POA: Insufficient documentation

## 2016-09-24 LAB — COMPREHENSIVE METABOLIC PANEL
ALK PHOS: 54 U/L (ref 38–126)
ALT: 8 U/L — ABNORMAL LOW (ref 17–63)
ANION GAP: 10 (ref 5–15)
AST: 19 U/L (ref 15–41)
Albumin: 3.6 g/dL (ref 3.5–5.0)
BILIRUBIN TOTAL: 0.7 mg/dL (ref 0.3–1.2)
BUN: 15 mg/dL (ref 6–20)
CALCIUM: 8.1 mg/dL — AB (ref 8.9–10.3)
CO2: 20 mmol/L — ABNORMAL LOW (ref 22–32)
Chloride: 105 mmol/L (ref 101–111)
Creatinine, Ser: 0.89 mg/dL (ref 0.61–1.24)
GFR calc Af Amer: >60 mL/min (ref >60)
Glucose, Bld: 81 mg/dL (ref 65–99)
Potassium: 3.2 mmol/L — ABNORMAL LOW (ref 3.5–5.1)
Sodium: 135 mmol/L (ref 135–145)
TOTAL PROTEIN: 6.7 g/dL (ref 6.5–8.1)

## 2016-09-24 LAB — CBC WITH DIFFERENTIAL/PLATELET
Basophils Absolute: 0 10*3/uL (ref 0.0–0.1)
Basophils Relative: 0 %
EOS PCT: 2 %
Eosinophils Absolute: 0.1 10*3/uL (ref 0.0–0.7)
HEMATOCRIT: 32 % — AB (ref 39.0–52.0)
Hemoglobin: 10.6 g/dL — ABNORMAL LOW (ref 13.0–17.0)
LYMPHS ABS: 1.7 10*3/uL (ref 0.7–4.0)
LYMPHS PCT: 49 %
MCH: 29.6 pg (ref 26.0–34.0)
MCHC: 33.1 g/dL (ref 30.0–36.0)
MCV: 89.4 fL (ref 78.0–100.0)
MONO ABS: 0.4 10*3/uL (ref 0.1–1.0)
Monocytes Relative: 10 %
Neutro Abs: 1.4 10*3/uL — ABNORMAL LOW (ref 1.7–7.7)
Neutrophils Relative %: 39 %
PLATELETS: 73 10*3/uL — AB (ref 150–400)
RBC: 3.58 MIL/uL — ABNORMAL LOW (ref 4.22–5.81)
RDW: 13.7 % (ref 11.5–15.5)
WBC: 3.5 10*3/uL — ABNORMAL LOW (ref 4.0–10.5)

## 2016-09-24 LAB — ETHANOL: ALCOHOL ETHYL (B): 171 mg/dL — AB (ref <5)

## 2016-09-24 MED ORDER — THIAMINE HCL 100 MG/ML IJ SOLN
Freq: Once | INTRAVENOUS | Status: AC
  Administered 2016-09-24: via INTRAVENOUS
  Filled 2016-09-24: qty 1000

## 2016-09-24 NOTE — ED Provider Notes (Signed)
[facility] Provider Note   CSN: [id number] Arrival date & time: 09/24/16  2127     History   Chief Complaint Chief Complaint  Patient presents with  . Fall    HPI Albert Bond is a 66 y.o. male.  HPI History is limited.per EMR, patient typically sits outside of [employer] and drinks 40 ounces beers sitting next to his walker most of the day. The patient reports that he is here because he drinks a lot and got drunk and fell over. He denies he's having any pain. He was brought in in a cervical collar. He reports he doesn't hurt in the neck in his neck isn't broken. Per EMS the patient was sitting out front of [employer] and had fallen over onto his right side. Somehow there is report of him having struck his head on the curb. Past Medical History:  Diagnosis Date  . Alcohol abuse   . Back pain   . Homelessness     Patient Active Problem List   Diagnosis Date Noted  . Alcohol abuse 08/03/2015  . Pressure ulcer 08/02/2015  . Dehydration 08/01/2015  . Thrombocytopenia (HCC) 08/01/2015  . UTI (lower urinary tract infection) 08/01/2015  . Tobacco use disorder 09/12/2013  . Left-sided weakness 08/24/2013  . Syncope 08/24/2013  . Anemia 08/24/2013    Past Surgical History:  Procedure Laterality Date  . ANTERIOR CERVICAL DECOMP/DISCECTOMY FUSION N/A 08/30/2013   Procedure: ANTERIOR CERVICAL DECOMPRESSION/DISCECTOMY FUSION 2 LEVELS;  Surgeon: Lisbeth Renshaw, MD;  Location: [facility];  Service: Neurosurgery;  Laterality: N/A;  C34 C45 anterior cervical decompression with fusion interbody prosthesis plating and bonegraft       Home Medications    Prior to Admission medications   Medication Sig Start Date End Date Taking? Authorizing Provider  aspirin EC 81 MG tablet Take 81 mg by mouth daily.   Yes [provider]    Family History Family History  Problem Relation Age of Onset  . CAD Mother   . CAD Father     Social  History Social History  Substance Use Topics  . Smoking status: Current Every Day Smoker    Packs/day: 0.50    Years: 40.00    Types: Cigarettes  . Smokeless tobacco: Never Used  . Alcohol use Yes     Comment: Heavily     Allergies   Patient has no known allergies.   Review of Systems Review of Systems Cannot obtain review systems level V caveat acute alcohol intoxication.  Physical Exam Updated Vital Signs BP 119/82   Pulse 82   Temp 98.7 F (37.1 C) (Oral)   Resp 12   SpO2 100%   Physical Exam  Constitutional:  Patient is awake and in no acute distress. No respiratory distress. Alert. Complaining about the cervical collar.  HENT:  Head: Normocephalic and atraumatic.  Mouth/Throat: Oropharynx is clear and moist.  Poor dentition. Airway is patent. No pooling secretions.  Eyes: Pupils are equal, round, and reactive to light.  Neck: Neck supple.  Patient denies any cervicalspine tenderness to palpation.  Cardiovascular: Normal rate, regular rhythm, normal heart sounds and intact distal pulses.   Pulmonary/Chest: Effort normal and breath sounds normal.  Abdominal: Soft. He exhibits no distension. There is no tenderness.  Musculoskeletal:  No obvious acute injury. No deformities or acute abrasions. Multiple old scars.  Neurological:  Patient is awake and interactive. At baseline his speech is difficult to understand but content is appropriate to current situation (  albeit containing some crude sexual references). Patient follows commands to perform grip strength and move each leg independently.  Skin: Skin is warm and dry.  Psychiatric: He has a normal mood and affect.     ED Treatments / Results  Labs (all labs ordered are listed, but only abnormal results are displayed) Labs Reviewed  COMPREHENSIVE METABOLIC PANEL - Abnormal; Notable for the following:       Result Value   Potassium 3.2 (*)    CO2 20 (*)    Calcium 8.1 (*)    ALT 8 (*)    All other  components within normal limits  ETHANOL - Abnormal; Notable for the following:    Alcohol, Ethyl (B) 171 (*)    All other components within normal limits  CBC WITH DIFFERENTIAL/PLATELET - Abnormal; Notable for the following:    WBC 3.5 (*)    RBC 3.58 (*)    Hemoglobin 10.6 (*)    HCT 32.0 (*)    Platelets 73 (*)    Neutro Abs 1.4 (*)    All other components within normal limits  URINALYSIS, ROUTINE W REFLEX MICROSCOPIC  RAPID URINE DRUG SCREEN, HOSP PERFORMED    EKG  EKG Interpretation None       Radiology Ct Head Wo Contrast  Result Date: 09/24/2016 CLINICAL DATA:  Patient fell, striking head on the car. Alcohol use. EXAM: CT HEAD WITHOUT CONTRAST TECHNIQUE: Contiguous axial images were obtained from the base of the skull through the vertex without intravenous contrast. COMPARISON:  12/13/2015 FINDINGS: Brain: Mild diffuse cerebral atrophy. No ventricular dilatation. Low-attenuation changes in the deep white matter consistent with small vessel ischemia. Old lacunar infarcts in the right basal ganglia. Basal ganglia calcifications. No mass effect or midline shift. No abnormal extra-axial fluid collections. Gray-white matter junctions are distinct. Basal cisterns are not effaced. No acute intracranial hemorrhage. Vascular: Vascular calcifications in the internal carotid and vertebrobasilar arteries. Skull: Calvarium appears intact. Sinuses/Orbits: Retention cyst in the right maxillary antrum. No acute air-fluid levels in the paranasal sinuses. Mastoid air cells are not opacified. Old nasal bone deformities. Other: None. IMPRESSION: No acute intracranial abnormalities. Chronic atrophy and small vessel ischemic changes. Old lacune or infarcts. Electronically Signed   By: Burman Nieves M.D.   On: 09/24/2016 23:36    Procedures Procedures (including critical care time)  Medications Ordered in ED Medications  sodium chloride 0.9 % 1,000 mL with thiamine 100 mg, folic acid 1 mg,  multivitamins adult 10 mL infusion (not administered)     Initial Impression / Assessment and Plan / ED Course  I have reviewed the triage vital signs and the nursing notes.  Pertinent labs & imaging results that were available during my care of the patient were reviewed by me and considered in my medical decision making (see chart for details).     Final Clinical Impressions(s) / ED Diagnoses   Final diagnoses:  Alcoholic intoxication without complication (HCC)  Minor head injury, initial encounter  CT scan does not identify any acute intracranial injury. Patient reports he drinks and falls over because he is drunk. This is consistent with EMR review of prior visits. No bystanders or family members or friends came with patients to give any additional history. At this time, patient is in no distress and does not appear to have any focal neurologic deficit. No acute injuries are identified. Plan will be for discharge.  New Prescriptions New Prescriptions   No medications on file     Aarionna Germer,  Lebron Conners, MD 09/25/16 1610

## 2016-09-24 NOTE — ED Notes (Signed)
Dr. Pfeiffer at bedside   

## 2016-09-24 NOTE — ED Triage Notes (Signed)
Pt was sitting on his walker at [employer] and fell, hitting his head on the curb. Also c/o R sided flank pain. Pt admits to drinking 3-40ozbeer today. Per EMS, pt's walker is with security at [employer]. C-collar in place

## 2016-09-24 NOTE — ED Notes (Signed)
Pt taken to CT.

## 2016-09-25 NOTE — ED Notes (Signed)
Pt awake and eating breakfast.

## 2016-09-25 NOTE — ED Notes (Signed)
Pt's belongings being taken with patient.

## 2016-09-25 NOTE — ED Notes (Signed)
[employer] called and verified that they have patient's walker. Pt to be taken to get walker by [facility]. Unable to ambulate without walker

## 2016-09-25 NOTE — Progress Notes (Addendum)
CSW spoke with Elnita Maxwell at [facility] to establish where pt's walker is. CSW was informed by RN that pt's walker is at the shelter and pt is unable to be mobile without. Elnita Maxwell is to call CSW back about walker after she locates pt's walker or another one that Elnita Maxwell mentions pt can have.     Claude Manges Lametria Klunk, MSW, LCSW-A Emergency Department Clinical Social Worker [phone]

## 2016-09-25 NOTE — ED Notes (Signed)
Patient continues to rest with covers over his head.  Resp even and non labored.  Condom cath patent

## 2016-09-25 NOTE — ED Notes (Signed)
Received care of patient

## 2016-09-25 NOTE — ED Notes (Signed)
Patient resting with cover over head.

## 2016-09-25 NOTE — ED Notes (Signed)
Pt ambulated to the bathroom and in the hallway with walker.

## 2016-10-17 ENCOUNTER — Encounter (HOSPITAL_COMMUNITY): Payer: Self-pay | Admitting: Emergency Medicine

## 2016-10-17 ENCOUNTER — Emergency Department (HOSPITAL_COMMUNITY)
Admission: EM | Admit: 2016-10-17 | Discharge: 2016-10-18 | Disposition: A | Discharge status: Home / Self Care | Payer: Self-pay | Attending: Emergency Medicine | Admitting: Emergency Medicine

## 2016-10-17 DIAGNOSIS — F17210 Nicotine dependence, cigarettes, uncomplicated: Secondary | ICD-10-CM | POA: Insufficient documentation

## 2016-10-17 DIAGNOSIS — F10920 Alcohol use, unspecified with intoxication, uncomplicated: Secondary | ICD-10-CM | POA: Insufficient documentation

## 2016-10-17 DIAGNOSIS — Z590 Homelessness: Secondary | ICD-10-CM | POA: Insufficient documentation

## 2016-10-17 DIAGNOSIS — Z7982 Long term (current) use of aspirin: Secondary | ICD-10-CM | POA: Insufficient documentation

## 2016-10-17 LAB — HEPATIC FUNCTION PANEL
ALBUMIN: 3.9 g/dL (ref 3.5–5.0)
ALK PHOS: 58 U/L (ref 38–126)
ALT: 10 U/L — ABNORMAL LOW (ref 17–63)
AST: 21 U/L (ref 15–41)
Bilirubin, Direct: <0.1 mg/dL — ABNORMAL LOW (ref 0.1–0.5)
TOTAL PROTEIN: 7.5 g/dL (ref 6.5–8.1)
Total Bilirubin: 0.8 mg/dL (ref 0.3–1.2)

## 2016-10-17 LAB — CBC
HEMATOCRIT: 35.9 % — AB (ref 39.0–52.0)
HEMOGLOBIN: 11.4 g/dL — AB (ref 13.0–17.0)
MCH: 29.1 pg (ref 26.0–34.0)
MCHC: 31.8 g/dL (ref 30.0–36.0)
MCV: 91.6 fL (ref 78.0–100.0)
PLATELETS: 94 10*3/uL — AB (ref 150–400)
RBC: 3.92 MIL/uL — AB (ref 4.22–5.81)
RDW: 15.2 % (ref 11.5–15.5)
WBC: 3.5 10*3/uL — ABNORMAL LOW (ref 4.0–10.5)

## 2016-10-17 LAB — CBG MONITORING, ED: Glucose-Capillary: 82 mg/dL (ref 65–99)

## 2016-10-17 LAB — BASIC METABOLIC PANEL
Anion gap: 13 (ref 5–15)
BUN: 12 mg/dL (ref 6–20)
CALCIUM: 9 mg/dL (ref 8.9–10.3)
CHLORIDE: 106 mmol/L (ref 101–111)
CO2: 20 mmol/L — AB (ref 22–32)
CREATININE: 0.87 mg/dL (ref 0.61–1.24)
GFR calc non Af Amer: >60 mL/min (ref >60)
GLUCOSE: 84 mg/dL (ref 65–99)
Potassium: 3.5 mmol/L (ref 3.5–5.1)
Sodium: 139 mmol/L (ref 135–145)

## 2016-10-17 LAB — TROPONIN I

## 2016-10-17 LAB — ETHANOL: ALCOHOL ETHYL (B): 155 mg/dL — AB (ref <10)

## 2016-10-17 NOTE — ED Notes (Signed)
Checked CBG 82, RN Arlys John informed

## 2016-10-17 NOTE — ED Triage Notes (Signed)
Pt BIB EMS for syncopal episode. Pt was drinking with friends outside of [employer] when he "passes out" for several minutes. Pt was sitting in his walker; denies falling, injury, or trauma. Pt endorses heavy ETOH use today. Pt has lower leg weakness and slurred speech from previous stroke. A&Ox4; NAD.

## 2016-10-17 NOTE — ED Provider Notes (Signed)
[facility] Provider Note   CSN: [id number] Arrival date & time: 10/17/16  1846     History   Chief Complaint Chief Complaint  Patient presents with  . Near Syncope  . Alcohol Intoxication   Level V caveat due to intoxication HPI Albert Bond is a 66 y.o. male.  HPI Patient presents from [employer]. Reportedly passed out. States that he just drank too much and passed out. States he was drinking Copy. No chest pain. No headache. States he drinks heavily a lot. Denies difficulty breathing. He asks for a sandwich. States he just had too much to drink. Past Medical History:  Diagnosis Date  . Alcohol abuse   . Back pain   . Homelessness     Patient Active Problem List   Diagnosis Date Noted  . Alcohol abuse 08/03/2015  . Pressure ulcer 08/02/2015  . Dehydration 08/01/2015  . Thrombocytopenia (HCC) 08/01/2015  . UTI (lower urinary tract infection) 08/01/2015  . Tobacco use disorder 09/12/2013  . Left-sided weakness 08/24/2013  . Syncope 08/24/2013  . Anemia 08/24/2013    Past Surgical History:  Procedure Laterality Date  . ANTERIOR CERVICAL DECOMP/DISCECTOMY FUSION N/A 08/30/2013   Procedure: ANTERIOR CERVICAL DECOMPRESSION/DISCECTOMY FUSION 2 LEVELS;  Surgeon: Lisbeth Renshaw, MD;  Location: [facility];  Service: Neurosurgery;  Laterality: N/A;  C34 C45 anterior cervical decompression with fusion interbody prosthesis plating and bonegraft       Home Medications    Prior to Admission medications   Medication Sig Start Date End Date Taking? Authorizing Provider  aspirin EC 81 MG tablet Take 81 mg by mouth daily.   Yes [provider]  ibuprofen (ADVIL,MOTRIN) 200 MG tablet Take 400 mg by mouth every 6 (six) hours as needed for moderate pain.   Yes [provider]    Family History Family History  Problem Relation Age of Onset  . CAD Mother   . CAD Father     Social History Social History  Substance Use Topics    . Smoking status: Current Every Day Smoker    Packs/day: 0.50    Years: 40.00    Types: Cigarettes  . Smokeless tobacco: Never Used  . Alcohol use Yes     Comment: Heavily     Allergies   Patient has no known allergies.   Review of Systems Review of Systems  Constitutional: Negative for fever.  Cardiovascular: Negative for chest pain.  Gastrointestinal: Negative for abdominal pain.  Genitourinary: Negative for flank pain.  Musculoskeletal: Negative for arthralgias and back pain.  Neurological: Positive for syncope.     Physical Exam Updated Vital Signs BP 118/89 (BP Location: Right Arm)   Pulse 66   Temp 97.7 F (36.5 C) (Oral)   Resp 14   Ht  (1.676 m)   Wt 71.2 kg (157 lb)   SpO2 100%   BMI 25.34 kg/m   Physical Exam  Constitutional: He appears well-developed.  HENT:  Head: Atraumatic.  Eyes: EOM are normal.  Neck: Neck supple.  Cardiovascular: Normal rate.   Pulmonary/Chest: No respiratory distress.  Abdominal: There is no tenderness.  Musculoskeletal: He exhibits no edema.  Neurological: He is alert.  Patient is intoxicated, however otherwise awake and pleasant.  Skin: Skin is warm. Capillary refill takes less than 2 seconds.     ED Treatments / Results  Labs (all labs ordered are listed, but only abnormal results are displayed) Labs Reviewed  BASIC METABOLIC PANEL - Abnormal; Notable for the  following:       Result Value   CO2 20 (*)    All other components within normal limits  CBC - Abnormal; Notable for the following:    WBC 3.5 (*)    RBC 3.92 (*)    Hemoglobin 11.4 (*)    HCT 35.9 (*)    Platelets 94 (*)    All other components within normal limits  ETHANOL - Abnormal; Notable for the following:    Alcohol, Ethyl (B) 155 (*)    All other components within normal limits  HEPATIC FUNCTION PANEL - Abnormal; Notable for the following:    ALT 10 (*)    Bilirubin, Direct <0.1 (*)    All other components within normal limits   TROPONIN I  URINALYSIS, ROUTINE W REFLEX MICROSCOPIC  CBG MONITORING, ED    EKG  EKG Interpretation  Date/Time:  Friday October 17 2016 19:03:04 EDT Ventricular Rate:  58 PR Interval:  152 QRS Duration: 82 QT Interval:  482 QTC Calculation: 473 R Axis:   63 Text Interpretation:  Sinus bradycardia Otherwise normal ECG Confirmed by Benjiman Core [phone]) on 10/17/2016 7:55:58 PM       Radiology No results found.  Procedures Procedures (including critical care time)  Medications Ordered in ED Medications - No data to display   Initial Impression / Assessment and Plan / ED Course  I have reviewed the triage vital signs and the nursing notes.  Pertinent labs & imaging results that were available during my care of the patient were reviewed by me and considered in my medical decision making (see chart for details).     Patient with alcohol intoxication. Likely pass out from this.labs reassuring. Has had elevated alcohol levels like this in the past that has been symptomatic with. Appears stable at this time. Will discharge home.   Final Clinical Impressions(s) / ED Diagnoses   Final diagnoses:  Alcoholic intoxication without complication [facility])    New Prescriptions New Prescriptions   No medications on file     Benjiman Core, MD 10/18/16 0025

## 2016-12-13 ENCOUNTER — Emergency Department (HOSPITAL_COMMUNITY)
Admission: EM | Admit: 2016-12-13 | Discharge: 2016-12-13 | Disposition: A | Discharge status: Home / Self Care | Payer: Self-pay | Attending: Emergency Medicine | Admitting: Emergency Medicine

## 2016-12-13 ENCOUNTER — Other Ambulatory Visit: Payer: Self-pay

## 2016-12-13 ENCOUNTER — Emergency Department (HOSPITAL_COMMUNITY): Payer: Self-pay

## 2016-12-13 DIAGNOSIS — Y9301 Activity, walking, marching and hiking: Secondary | ICD-10-CM | POA: Insufficient documentation

## 2016-12-13 DIAGNOSIS — W19XXXA Unspecified fall, initial encounter: Secondary | ICD-10-CM

## 2016-12-13 DIAGNOSIS — Z7982 Long term (current) use of aspirin: Secondary | ICD-10-CM | POA: Insufficient documentation

## 2016-12-13 DIAGNOSIS — Y999 Unspecified external cause status: Secondary | ICD-10-CM | POA: Insufficient documentation

## 2016-12-13 DIAGNOSIS — S80212A Abrasion, left knee, initial encounter: Secondary | ICD-10-CM | POA: Insufficient documentation

## 2016-12-13 DIAGNOSIS — R059 Cough, unspecified: Secondary | ICD-10-CM

## 2016-12-13 DIAGNOSIS — F10120 Alcohol abuse with intoxication, uncomplicated: Secondary | ICD-10-CM | POA: Insufficient documentation

## 2016-12-13 DIAGNOSIS — F10920 Alcohol use, unspecified with intoxication, uncomplicated: Secondary | ICD-10-CM

## 2016-12-13 DIAGNOSIS — S0990XA Unspecified injury of head, initial encounter: Secondary | ICD-10-CM | POA: Insufficient documentation

## 2016-12-13 DIAGNOSIS — Y92480 Sidewalk as the place of occurrence of the external cause: Secondary | ICD-10-CM | POA: Insufficient documentation

## 2016-12-13 DIAGNOSIS — R05 Cough: Secondary | ICD-10-CM | POA: Insufficient documentation

## 2016-12-13 DIAGNOSIS — W01198A Fall on same level from slipping, tripping and stumbling with subsequent striking against other object, initial encounter: Secondary | ICD-10-CM | POA: Insufficient documentation

## 2016-12-13 DIAGNOSIS — F17210 Nicotine dependence, cigarettes, uncomplicated: Secondary | ICD-10-CM | POA: Insufficient documentation

## 2016-12-13 DIAGNOSIS — Z590 Homelessness: Secondary | ICD-10-CM | POA: Insufficient documentation

## 2016-12-13 DIAGNOSIS — Z23 Encounter for immunization: Secondary | ICD-10-CM | POA: Insufficient documentation

## 2016-12-13 LAB — ETHANOL: Alcohol, Ethyl (B): 136 mg/dL — ABNORMAL HIGH (ref <10)

## 2016-12-13 MED ORDER — GUAIFENESIN ER 600 MG PO TB12
600.0000 mg | ORAL_TABLET | Freq: Once | ORAL | Status: AC
  Administered 2016-12-13: 600 mg via ORAL
  Filled 2016-12-13: qty 1

## 2016-12-13 MED ORDER — BENZONATATE 100 MG PO CAPS
100.0000 mg | ORAL_CAPSULE | Freq: Once | ORAL | Status: AC
  Administered 2016-12-13: 100 mg via ORAL
  Filled 2016-12-13: qty 1

## 2016-12-13 MED ORDER — TETANUS-DIPHTH-ACELL PERTUSSIS 5-2.5-18.5 LF-MCG/0.5 IM SUSP
0.5000 mL | Freq: Once | INTRAMUSCULAR | Status: AC
  Administered 2016-12-13: 0.5 mL via INTRAMUSCULAR
  Filled 2016-12-13: qty 0.5

## 2016-12-13 MED ORDER — SODIUM CHLORIDE 0.9 % IV BOLUS (SEPSIS)
1000.0000 mL | Freq: Once | INTRAVENOUS | Status: AC
  Administered 2016-12-13: 1000 mL via INTRAVENOUS

## 2016-12-13 NOTE — ED Provider Notes (Signed)
[facility]-EMERGENCY DEPT Provider Note   CSN: [id number] Arrival date & time: 12/13/16  0006     History   Chief Complaint Chief Complaint  Patient presents with  . Fall  . Alcohol Intoxication    HPI Albert Bond is a 66 y.o. male.  HPI 66 year old male past medical history significant for alcohol abuse presents to the ED today with alcohol intoxication and mechanical fall.  She was brought in by EMS.  Patient fell from walker and tripped onto the sidewalk and hit his forehead.  Patient states he does drink a pint of liquor today.  Patient is well-known to the ED has had multiple visits for alcohol intoxication.  Patient is homeless.  Patient initially complained of a headache but has resolved at this time.  He also complains of some pain to his left knee with some abrasions.  Patient states his tetanus shot is not up-to-date.  Patient denies any associated neck pain, back pain, chest pain, abdominal pain.  Patient also complains of a cough that is nonproductive.  Patient does use tobacco.  Reports that he needs something for a cold.  Reports some nasal congestion denies any associated fevers.  Patient not taking for his symptoms.  Nothing makes better or worse.  Pt denies any fever, chill, ha, vision changes, lightheadedness, dizziness, congestion, neck pain, cp, sob, abd pain, n/v/d, urinary symptoms, change in bowel habits, melena, hematochezia, lower extremity paresthesias.  Past Medical History:  Diagnosis Date  . Alcohol abuse   . Back pain   . Homelessness     Patient Active Problem List   Diagnosis Date Noted  . Alcohol abuse 08/03/2015  . Pressure ulcer 08/02/2015  . Dehydration 08/01/2015  . Thrombocytopenia (HCC) 08/01/2015  . UTI (lower urinary tract infection) 08/01/2015  . Tobacco use disorder 09/12/2013  . Left-sided weakness 08/24/2013  . Syncope 08/24/2013  . Anemia 08/24/2013    Past Surgical History:  Procedure Laterality  Date  . ANTERIOR CERVICAL DECOMP/DISCECTOMY FUSION N/A 08/30/2013   Procedure: ANTERIOR CERVICAL DECOMPRESSION/DISCECTOMY FUSION 2 LEVELS;  Surgeon: Lisbeth Renshaw, MD;  Location: [facility];  Service: Neurosurgery;  Laterality: N/A;  C34 C45 anterior cervical decompression with fusion interbody prosthesis plating and bonegraft       Home Medications    Prior to Admission medications   Medication Sig Start Date End Date Taking? Authorizing Provider  aspirin EC 81 MG tablet Take 81 mg by mouth daily.   Yes [provider]  ibuprofen (ADVIL,MOTRIN) 200 MG tablet Take 400 mg by mouth every 6 (six) hours as needed for moderate pain.   Yes [provider]    Family History Family History  Problem Relation Age of Onset  . CAD Mother   . CAD Father     Social History Social History   Tobacco Use  . Smoking status: Current Every Day Smoker    Packs/day: 0.50    Years: 40.00    Pack years: 20.00    Types: Cigarettes  . Smokeless tobacco: Never Used  Substance Use Topics  . Alcohol use: Yes    Comment: Heavily  . Drug use: Yes    Types: Marijuana     Allergies   Patient has no known allergies.   Review of Systems Review of Systems  Constitutional: Negative for chills and fever.  HENT: Positive for congestion. Negative for sore throat.   Eyes: Negative for visual disturbance.  Respiratory: Positive for cough. Negative for shortness of  breath.   Cardiovascular: Negative for chest pain.  Gastrointestinal: Negative for abdominal pain, diarrhea, nausea and vomiting.  Genitourinary: Negative for dysuria, flank pain, frequency, hematuria, scrotal swelling, testicular pain and urgency.  Musculoskeletal: Positive for arthralgias and myalgias.  Skin: Positive for wound. Negative for rash.  Neurological: Negative for dizziness, syncope, weakness, light-headedness, numbness and headaches.  Psychiatric/Behavioral: Negative for sleep disturbance. The patient is  not nervous/anxious.      Physical Exam Updated Vital Signs BP 113/71   Pulse 84   Temp 97.9 F (36.6 C) (Oral)   Resp 18   SpO2 100%   Physical Exam Physical Exam  Constitutional: Pt is oriented to person, place, and time. Appears well-developed and well-nourished. No distress.  HENT:  Head: Normocephalic and atraumatic.  Ears: No bilateral hemotympanum. Nose: Nose normal. No septal hematoma. Mouth/Throat: Uvula is midline, oropharynx is clear and moist and mucous membranes are normal.  Eyes: Conjunctivae and EOM are normal. Pupils are equal, round, and reactive to light.  Neck: No spinous process tenderness and no muscular tenderness present. No rigidity. Normal range of motion present.  Full ROM without pain No midline cervical tenderness No crepitus, deformity or step-offs  No paraspinal tenderness  Cardiovascular: Normal rate, regular rhythm and intact distal pulses.   Pulses:      Radial pulses are 2+ on the right side, and 2+ on the left side.       Dorsalis pedis pulses are 2+ on the right side, and 2+ on the left side.       Posterior tibial pulses are 2+ on the right side, and 2+ on the left side.  Pulmonary/Chest: Effort normal and breath sounds normal. No accessory muscle usage. No respiratory distress. No decreased breath sounds. No wheezes. No rhonchi. No rales. Exhibits no tenderness and no bony tenderness.  No flail segment, crepitus or deformity Equal chest expansion  Abdominal: Soft. Normal appearance and bowel sounds are normal. There is no tenderness. There is no rigidity, no guarding and no CVA tenderness.  Abd soft and nontender  Musculoskeletal: Normal range of motion.       Thoracic back: Exhibits normal range of motion.       Lumbar back: Exhibits normal range of motion.  Full range of motion of the T-spine and L-spine No tenderness to palpation of the spinous processes of the T-spine or L-spine No crepitus, deformity or step-offs No tenderness to  palpation of the paraspinous muscles of the L-spine  Pelvis is stable.  Patient does have an abrasion to the lateral aspect of his left knee.  No obvious deformity, ecchymosis, edema, effusion noted.  No erythema, warmth noted.  Full range of motion.  DP pulses are 2+ bilaterally.  Brisk cap refill.  Sensation intact. Lymphadenopathy:    Pt has no cervical adenopathy.  Neurological: Pt is alert and oriented to person, place, and time. Normal reflexes. No cranial nerve deficit. GCS eye subscore is 4. GCS verbal subscore is 5. GCS motor subscore is 6.  Reflex Scores:      Bicep reflexes are 2+ on the right side and 2+ on the left side.      Brachioradialis reflexes are 2+ on the right side and 2+ on the left side.      Patellar reflexes are 2+ on the right side and 2+ on the left side.      Achilles reflexes are 2+ on the right side and 2+ on the left side. Speech is clear and goal  oriented, follows commands Normal 5/5 strength in upper and lower extremities bilaterally including dorsiflexion and plantar flexion, strong and equal grip strength Sensation normal to light and sharp touch Moves extremities without ataxia, coordination intact Normal gait and balance No Clonus  Skin: Skin is warm and dry. No rash noted. Pt is not diaphoretic. No erythema.  Psychiatric: Normal mood and affect.  Nursing note and vitals reviewed.     ED Treatments / Results  Labs (all labs ordered are listed, but only abnormal results are displayed) Labs Reviewed  ETHANOL - Abnormal; Notable for the following components:      Result Value   Alcohol, Ethyl (B) 136 (*)    All other components within normal limits    EKG  EKG Interpretation None       Radiology Dg Chest 2 View  Result Date: 12/13/2016 CLINICAL DATA:  Cough. EXAM: CHEST  2 VIEW COMPARISON:  December 13, 2015 FINDINGS: There is a calcified nodule seen in the left upper lobe. A few other small calcified granulomas are identified. Calcified  nodes seen in the left hilum. The cardiomediastinal silhouette is normal. No pneumothorax. No masses or infiltrates. No acute abnormalities identified. IMPRESSION: No active cardiopulmonary disease. Electronically Signed   By: Gerome Sam III M.D   On: 12/13/2016 04:22   Dg Knee 2 Views Left  Result Date: 12/13/2016 CLINICAL DATA:  Left knee pain after fall EXAM: LEFT KNEE - 1-2 VIEW COMPARISON:  None. FINDINGS: Chondrocalcinosis identified consistent with CPPD. Vascular calcifications are noted. There degenerative changes most prominent in the medial compartment. No joint effusion is identified. No fracture is noted. Anterior soft tissue swelling is identified. IMPRESSION: No fracture, dislocation, or joint effusion. Degenerative changes. Chondrocalcinosis consistent with CPPD. Anterior soft tissue swelling. Electronically Signed   By: Gerome Sam III M.D   On: 12/13/2016 04:24   Ct Head Wo Contrast  Result Date: 12/13/2016 CLINICAL DATA:  Pain after trauma EXAM: CT HEAD WITHOUT CONTRAST CT CERVICAL SPINE WITHOUT CONTRAST TECHNIQUE: Multidetector CT imaging of the head and cervical spine was performed following the standard protocol without intravenous contrast. Multiplanar CT image reconstructions of the cervical spine were also generated. COMPARISON:  September 24, 2016 head CT FINDINGS: CT HEAD FINDINGS Brain: No subdural, epidural, or subarachnoid hemorrhage. Cerebellum, brainstem, and basal cisterns are normal. There is a lacunar infarct in the right basal ganglia which is stable. Basal ganglia calcifications are stable. Scattered white matter changes are stable. No acute cortical ischemia or infarct. No mass effect or midline shift. Vascular: Calcified atherosclerosis in the intracranial carotids. Skull: Normal. Negative for fracture or focal lesion. Sinuses/Orbits: Mucosal thickening and debris is seen in the sphenoid sinuses. A small amount of fluid is seen in the right maxillary sinus.  Scattered opacification of ethmoid air cells. Mastoid air cells and middle ears are well aerated. Other: None. CT CERVICAL SPINE FINDINGS Alignment: Anterior plate and screws extend from C3 through C5. There is straightening of normal lordosis. No traumatic malalignment. Skull base and vertebrae: No acute fracture. No primary bone lesion or focal pathologic process. Soft tissues and spinal canal: Calcified atherosclerosis in the cervical portions of the carotid artery's. No other soft tissue abnormalities. Disc levels:  Multilevel degenerative changes. Upper chest: Negative. Other: No other abnormalities. IMPRESSION: 1. No acute intracranial abnormality. 2. No fracture or traumatic malalignment in the cervical spine. Degenerative changes. 3. Sinus disease as above. Electronically Signed   By: Gerome Sam III M.D   On: 12/13/2016 04:18  Ct Cervical Spine Wo Contrast  Result Date: 12/13/2016 CLINICAL DATA:  Pain after trauma EXAM: CT HEAD WITHOUT CONTRAST CT CERVICAL SPINE WITHOUT CONTRAST TECHNIQUE: Multidetector CT imaging of the head and cervical spine was performed following the standard protocol without intravenous contrast. Multiplanar CT image reconstructions of the cervical spine were also generated. COMPARISON:  September 24, 2016 head CT FINDINGS: CT HEAD FINDINGS Brain: No subdural, epidural, or subarachnoid hemorrhage. Cerebellum, brainstem, and basal cisterns are normal. There is a lacunar infarct in the right basal ganglia which is stable. Basal ganglia calcifications are stable. Scattered white matter changes are stable. No acute cortical ischemia or infarct. No mass effect or midline shift. Vascular: Calcified atherosclerosis in the intracranial carotids. Skull: Normal. Negative for fracture or focal lesion. Sinuses/Orbits: Mucosal thickening and debris is seen in the sphenoid sinuses. A small amount of fluid is seen in the right maxillary sinus. Scattered opacification of ethmoid air cells.  Mastoid air cells and middle ears are well aerated. Other: None. CT CERVICAL SPINE FINDINGS Alignment: Anterior plate and screws extend from C3 through C5. There is straightening of normal lordosis. No traumatic malalignment. Skull base and vertebrae: No acute fracture. No primary bone lesion or focal pathologic process. Soft tissues and spinal canal: Calcified atherosclerosis in the cervical portions of the carotid artery's. No other soft tissue abnormalities. Disc levels:  Multilevel degenerative changes. Upper chest: Negative. Other: No other abnormalities. IMPRESSION: 1. No acute intracranial abnormality. 2. No fracture or traumatic malalignment in the cervical spine. Degenerative changes. 3. Sinus disease as above. Electronically Signed   By: Gerome Samavid  Williams III M.D   On: 12/13/2016 04:18    Procedures Procedures (including critical care time)  Medications Ordered in ED Medications  sodium chloride 0.9 % bolus 1,000 mL (0 mLs Intravenous Stopped 12/13/16 0643)  benzonatate (TESSALON) capsule 100 mg (100 mg Oral Given 12/13/16 0355)  guaiFENesin (MUCINEX) 12 hr tablet 600 mg (600 mg Oral Given 12/13/16 0355)  Tdap (BOOSTRIX) injection 0.5 mL (0.5 mLs Intramuscular Given 12/13/16 [id number])     Initial Impression / Assessment and Plan / ED Course  I have reviewed the triage vital signs and the nursing notes.  Pertinent labs & imaging results that were available during my care of the patient were reviewed by me and considered in my medical decision making (see chart for details).     Patient presents to the ED for evaluation of alcohol intoxication and mechanical fall.  Patient no signs of significant trauma on exam.  Does have a small abrasion to his left knee.  Patient uses a walker at baseline.  Patient is well-known to the ED and is seen multiple times in the ED for alcohol intoxication.  Imaging was normal.  Patient has no focal neuro deficits.  Neurovascularly intact in all extremities.  Given  the abrasion tetanus shot was updated.  Patient also reports a cough and asking for cold medicine.  Lungs clear to auscultation bilaterally.  Chest x-ray was unremarkable.  Patient denies any chest pain or shortness of breath.  Low suspicion for ACS, pneumonia, dissection, PE.  Patient provided Mucinex and cough medicine.  Patient is alcohol 169.  With history of higher ethanol's.  Ambulates with normal gait.  Given her fluid bolus.  Appears clinically sober at this time.  Patient is eating a sandwich and drinking without any difficulties.  Pt is hemodynamically stable, in NAD, & able to ambulate in the ED. Evaluation does not show pathology that would require ongoing emergent  intervention or inpatient treatment. I explained the diagnosis to the patient. Pain has been managed & has no complaints prior to dc. Pt is comfortable with above plan and is stable for discharge at this time. All questions were answered prior to disposition. Strict return precautions for f/u to the ED were discussed. Encouraged follow up with PCP.    Final Clinical Impressions(s) / ED Diagnoses   Final diagnoses:  Alcoholic intoxication without complication (HCC)  Fall, initial encounter  Abrasion of left knee, initial encounter  Cough    ED Discharge Orders    None       Wallace KellerLeaphart, Nethaniel Mattie T, PA-C 12/13/16 0823    Melene PlanFloyd, Dan, DO 12/13/16 2303

## 2016-12-13 NOTE — ED Triage Notes (Signed)
Pt BIB [facility]. Pt c/o a fall from his walker. Stated he fell and hit his forehead. No trauma noted by EMS. CAOx4. Pt c/o head pain initially but not anymore. Pt is homeless.

## 2016-12-13 NOTE — ED Notes (Signed)
Pt has had food, drink and was able to walk, using his walker, with minimal assistance.

## 2016-12-13 NOTE — ED Notes (Signed)
Bed: WA11 Expected date:  Expected time:  Means of arrival:  Comments: For hall B 

## 2016-12-13 NOTE — ED Notes (Signed)
Bed: WHALB Expected date:  Expected time:  Means of arrival:  Comments: Ems  

## 2016-12-13 NOTE — ED Notes (Signed)
Pt to CT

## 2016-12-13 NOTE — Discharge Instructions (Signed)
Your workup has been reassuring.  Imagings were okay.  May use over-the-counter decongestants and cough medicine for your cough.  Follow-up as needed.  Stop drinking alcohol.

## 2017-01-10 ENCOUNTER — Emergency Department (HOSPITAL_COMMUNITY)
Admission: EM | Admit: 2017-01-10 | Discharge: 2017-01-11 | Disposition: A | Discharge status: Home / Self Care | Payer: Self-pay | Attending: Emergency Medicine | Admitting: Emergency Medicine

## 2017-01-10 ENCOUNTER — Emergency Department (HOSPITAL_COMMUNITY): Payer: Self-pay

## 2017-01-10 ENCOUNTER — Encounter (HOSPITAL_COMMUNITY): Payer: Self-pay | Admitting: Emergency Medicine

## 2017-01-10 ENCOUNTER — Other Ambulatory Visit: Payer: Self-pay

## 2017-01-10 DIAGNOSIS — Z7982 Long term (current) use of aspirin: Secondary | ICD-10-CM | POA: Insufficient documentation

## 2017-01-10 DIAGNOSIS — Z590 Homelessness: Secondary | ICD-10-CM | POA: Insufficient documentation

## 2017-01-10 DIAGNOSIS — W052XXA Fall from non-moving motorized mobility scooter, initial encounter: Secondary | ICD-10-CM | POA: Insufficient documentation

## 2017-01-10 DIAGNOSIS — R0782 Intercostal pain: Secondary | ICD-10-CM | POA: Insufficient documentation

## 2017-01-10 DIAGNOSIS — F17210 Nicotine dependence, cigarettes, uncomplicated: Secondary | ICD-10-CM | POA: Insufficient documentation

## 2017-01-10 DIAGNOSIS — F10920 Alcohol use, unspecified with intoxication, uncomplicated: Secondary | ICD-10-CM | POA: Insufficient documentation

## 2017-01-10 DIAGNOSIS — R079 Chest pain, unspecified: Secondary | ICD-10-CM

## 2017-01-10 LAB — CBC WITH DIFFERENTIAL/PLATELET
BASOS PCT: 0 %
Basophils Absolute: 0 10*3/uL (ref 0.0–0.1)
Eosinophils Absolute: 0 10*3/uL (ref 0.0–0.7)
Eosinophils Relative: 0 %
HEMATOCRIT: 35.7 % — AB (ref 39.0–52.0)
HEMOGLOBIN: 11.8 g/dL — AB (ref 13.0–17.0)
LYMPHS ABS: 0.8 10*3/uL (ref 0.7–4.0)
Lymphocytes Relative: 15 %
MCH: 29.8 pg (ref 26.0–34.0)
MCHC: 33.1 g/dL (ref 30.0–36.0)
MCV: 90.2 fL (ref 78.0–100.0)
MONOS PCT: 4 %
Monocytes Absolute: 0.2 10*3/uL (ref 0.1–1.0)
NEUTROS ABS: 4.2 10*3/uL (ref 1.7–7.7)
NEUTROS PCT: 81 %
Platelets: 145 10*3/uL — ABNORMAL LOW (ref 150–400)
RBC: 3.96 MIL/uL — ABNORMAL LOW (ref 4.22–5.81)
RDW: 13.6 % (ref 11.5–15.5)
WBC: 5.2 10*3/uL (ref 4.0–10.5)

## 2017-01-10 LAB — ETHANOL: Alcohol, Ethyl (B): 92 mg/dL — ABNORMAL HIGH (ref <10)

## 2017-01-10 NOTE — ED Triage Notes (Signed)
Pt brought in by EMS for alcohol intoxication.  Pt was found by a bystander lying on the ground; pt appeared etoh intoxicated.  Pt admits to drinking vodka tonight.

## 2017-01-10 NOTE — ED Provider Notes (Signed)
[facility]-EMERGENCY DEPT Provider Note   CSN: [id number] Arrival date & time: 01/10/17  2055     History   Chief Complaint Chief Complaint  Patient presents with  . Alcohol Intoxication    HPI Albert Bond is a 67 y.o. male.  The history is provided by the patient and medical records. No language interpreter was used.  Alcohol Intoxication  Associated symptoms include chest pain.   Albert Bond is a 67 y.o. male  with a PMH of alcohol abuse, homelessess who presents to the Emergency Department for evaluation for alcohol intoxication.  Patient was found by a bystander laying on the ground tonight.  Patient states that he had multiple vodka drinks tonight.  He states that he was on his wheelchair scooter when he fell onto the ground and did not want to get up.  He denies head injury. He did state that after laying on the ground his chest began to hurt.  He did not experience any shortness of breath.  He has no abdominal pain, nausea or vomiting.  He does report hitting his left knee which was painful, however is not hurting him anymore.  No medications were given prior to arrival for symptoms.  Patient denies any alleviating or aggravating factors.  Patient with no complaints today.   Past Medical History:  Diagnosis Date  . Alcohol abuse   . Back pain   . Homelessness     Patient Active Problem List   Diagnosis Date Noted  . Alcohol abuse 08/03/2015  . Pressure ulcer 08/02/2015  . Dehydration 08/01/2015  . Thrombocytopenia (HCC) 08/01/2015  . UTI (lower urinary tract infection) 08/01/2015  . Tobacco use disorder 09/12/2013  . Left-sided weakness 08/24/2013  . Syncope 08/24/2013  . Anemia 08/24/2013    Past Surgical History:  Procedure Laterality Date  . ANTERIOR CERVICAL DECOMP/DISCECTOMY FUSION N/A 08/30/2013   Procedure: ANTERIOR CERVICAL DECOMPRESSION/DISCECTOMY FUSION 2 LEVELS;  Surgeon: Lisbeth Renshaw, MD;  Location: [facility];  Service: Neurosurgery;  Laterality: N/A;  C34 C45 anterior cervical decompression with fusion interbody prosthesis plating and bonegraft       Home Medications    Prior to Admission medications   Medication Sig Start Date End Date Taking? Authorizing Provider  aspirin EC 81 MG tablet Take 81 mg by mouth daily.   Yes [provider]  ibuprofen (ADVIL,MOTRIN) 200 MG tablet Take 400 mg by mouth every 6 (six) hours as needed for moderate pain.   Yes [provider]    Family History Family History  Problem Relation Age of Onset  . CAD Mother   . CAD Father     Social History Social History   Tobacco Use  . Smoking status: Current Every Day Smoker    Packs/day: 0.50    Years: 40.00    Pack years: 20.00    Types: Cigarettes  . Smokeless tobacco: Never Used  Substance Use Topics  . Alcohol use: Yes    Comment: Heavily  . Drug use: Yes    Types: Marijuana     Allergies   Patient has no known allergies.   Review of Systems Review of Systems  Cardiovascular: Positive for chest pain. Negative for palpitations and leg swelling.  Musculoskeletal: Positive for arthralgias.  All other systems reviewed and are negative.    Physical Exam Updated Vital Signs BP 138/87   Pulse 62   Temp 97.8 F (36.6 C) (Oral)   Resp 18   SpO2  92%   Physical Exam  Constitutional: He is oriented to person, place, and time. He appears well-developed and well-nourished. No distress.  HENT:  Head: Normocephalic and atraumatic.  Cardiovascular: Normal rate, regular rhythm and normal heart sounds.  No murmur heard. Pulmonary/Chest: Effort normal and breath sounds normal. No respiratory distress.  Abdominal: Soft. He exhibits no distension. There is no tenderness.  Musculoskeletal:  Left knee with mild diffuse tenderness. Ligaments intact. Full ROM.  Neurological: He is alert and oriented to person, place, and time.  Bilateral lower extremities neurovascularly  intact. CN 2-12 grossly intact. A&Ox3.  Skin: Skin is warm and dry.  Nursing note and vitals reviewed.    ED Treatments / Results  Labs (all labs ordered are listed, but only abnormal results are displayed) Labs Reviewed  CBC WITH DIFFERENTIAL/PLATELET - Abnormal; Notable for the following components:      Result Value   RBC 3.96 (*)    Hemoglobin 11.8 (*)    HCT 35.7 (*)    Platelets 145 (*)    All other components within normal limits  COMPREHENSIVE METABOLIC PANEL - Abnormal; Notable for the following components:   Glucose, Bld 103 (*)    BUN 24 (*)    ALT 9 (*)    All other components within normal limits  ETHANOL - Abnormal; Notable for the following components:   Alcohol, Ethyl (B) 92 (*)    All other components within normal limits  TROPONIN I    EKG  EKG Interpretation  Date/Time:  Sunday January 11 2017 00:15:48 EST Ventricular Rate:  69 PR Interval:    QRS Duration: 89 QT Interval:  442 QTC Calculation: 474 R Axis:   56 Text Interpretation:  Sinus rhythm Low voltage, extremity leads Interpretation limited secondary to artifact Abnormal ekg Confirmed by Zadie Rhine ([id number]) on 01/11/2017 12:22:03 AM       Radiology Dg Chest 2 View  Result Date: 01/10/2017 CLINICAL DATA:  67 y/o  M; ETOH intoxication. EXAM: CHEST  2 VIEW COMPARISON:  12/13/2016 chest radiograph FINDINGS: Stable cardiac silhouette given projection and technique. Multiple calcified granuloma. No consolidation. No pleural effusion or pneumothorax. Bones are unremarkable. IMPRESSION: No acute pulmonary process identified. Electronically Signed   By: Mitzi Hansen M.D.   On: 01/10/2017 23:09   Ct Head Wo Contrast  Result Date: 01/10/2017 CLINICAL DATA:  Pt brought in by EMS for alcohol intoxication. Pt was found by a bystander lying on the ground; pt appeared ETOH intoxicated. Pt admits to drinking vodka tonight EXAM: CT HEAD WITHOUT CONTRAST TECHNIQUE: Contiguous axial images were  obtained from the base of the skull through the vertex without intravenous contrast. COMPARISON:  12/13/2016 FINDINGS: Brain: There is central and cortical atrophy. Periventricular white matter changes are consistent with small vessel disease. There is no intra or extra-axial fluid collection or mass lesion. The basilar cisterns and ventricles have a normal appearance. There is no CT evidence for acute infarction or hemorrhage. There is an old lacunar infarct of the right basal ganglia. Stable appearance of bilateral basal ganglia calcifications. Vascular: Dense atherosclerotic calcification of the internal carotid arteries. Skull: Normal. Negative for fracture or focal lesion. Sinuses/Orbits: No acute finding. Other: None. IMPRESSION: 1.  No evidence for acute  abnormality. 2. Atrophy and small vessel disease, stable. 3. 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 Electronically Signed   By: Norva Pavlov M.D.   On: 01/10/2017 23:08    Procedures Procedures (including critical care time)  Medications Ordered  in ED Medications - No data to display   Initial Impression / Assessment and Plan / ED Course  I have reviewed the triage vital signs and the nursing notes.  Pertinent labs & imaging results that were available during my care of the patient were reviewed by me and considered in my medical decision making (see chart for details).    Albert Bond is a 67 y.o. male who presents to ED for alcohol intoxication. He fell off of his scooter and then stated his chest hurt. Troponin negative. CXR negative. EKG non-ischemic. ETOH of 92, otherwise labs baseline. CT head negative. Initially complaining of left knee pain from the fall, but states that has now resolved. Knee exam reassuring. Plan was to obtain plain film, however patient refused. Care assumed by oncoming provider PA Hedges. Case discussed, plan agreed upon. Will continue to monitor in ED. Safe for discharge once clinically  sober.    Patient discussed with Dr. Silverio LayYao who agrees with treatment plan.    Final Clinical Impressions(s) / ED Diagnoses   Final diagnoses:  Chest pain    ED Discharge Orders    None       Ward, Chase PicketJaime Pilcher, PA-C 01/11/17 0035    Charlynne PanderYao, David Hsienta, MD 01/11/17 [phone]

## 2017-01-11 ENCOUNTER — Emergency Department (HOSPITAL_COMMUNITY): Payer: Self-pay

## 2017-01-11 LAB — COMPREHENSIVE METABOLIC PANEL
ALBUMIN: 3.9 g/dL (ref 3.5–5.0)
ALK PHOS: 61 U/L (ref 38–126)
ALT: 9 U/L — AB (ref 17–63)
ANION GAP: 13 (ref 5–15)
AST: 21 U/L (ref 15–41)
BILIRUBIN TOTAL: 0.9 mg/dL (ref 0.3–1.2)
BUN: 24 mg/dL — ABNORMAL HIGH (ref 6–20)
CALCIUM: 9.4 mg/dL (ref 8.9–10.3)
CO2: 24 mmol/L (ref 22–32)
CREATININE: 1.01 mg/dL (ref 0.61–1.24)
Chloride: 102 mmol/L (ref 101–111)
GFR calc Af Amer: >60 mL/min (ref >60)
GFR calc non Af Amer: >60 mL/min (ref >60)
GLUCOSE: 103 mg/dL — AB (ref 65–99)
Potassium: 4.2 mmol/L (ref 3.5–5.1)
Sodium: 139 mmol/L (ref 135–145)
TOTAL PROTEIN: 8.1 g/dL (ref 6.5–8.1)

## 2017-01-11 LAB — TROPONIN I: Troponin I: <0.03 ng/mL (ref <0.03)

## 2017-01-11 NOTE — ED Provider Notes (Signed)
  67 year old male signed out at shift change pending clinical sobriety and disposition.  Please see previous providers note for full H&P.  In short patient fell off his roller walker landing on his chest with chest discomfort.  Workup significant for negative plain films, reassuring labs and reassuring chest x-ray.  Patient's pain likely secondary to the fall unlikely ACS.  He is in no clinical distress, clinically sober and will be discharged with outpatient follow-up and strict return precautions.  He verbalized understanding and agreement to this plan had no further questions or concerns.  Vitals:   01/11/17 0400 01/11/17 0430  BP: 118/78 116/82  Pulse: 60 60  Resp: 10 10  Temp:    SpO2: 100% 100%      Eyvonne MechanicHedges, Aswad Wandrey, PA-C 01/11/17 [id number]    Zadie RhineWickline, Donald, MD 01/11/17 [phone]

## 2017-01-11 NOTE — Discharge Instructions (Signed)
Please read attached information. If you experience any new or worsening signs or symptoms please return to the emergency room for evaluation. Please follow-up with your primary care provider or specialist as discussed.  °

## 2017-07-12 ENCOUNTER — Emergency Department (HOSPITAL_COMMUNITY)
Admission: EM | Admit: 2017-07-12 | Discharge: 2017-07-12 | Disposition: A | Discharge status: Home / Self Care | Payer: Self-pay | Attending: Emergency Medicine | Admitting: Emergency Medicine

## 2017-07-12 ENCOUNTER — Encounter (HOSPITAL_COMMUNITY): Payer: Self-pay

## 2017-07-12 ENCOUNTER — Emergency Department (HOSPITAL_COMMUNITY): Payer: Self-pay

## 2017-07-12 ENCOUNTER — Other Ambulatory Visit: Payer: Self-pay

## 2017-07-12 DIAGNOSIS — F1010 Alcohol abuse, uncomplicated: Secondary | ICD-10-CM

## 2017-07-12 DIAGNOSIS — W010XXA Fall on same level from slipping, tripping and stumbling without subsequent striking against object, initial encounter: Secondary | ICD-10-CM | POA: Insufficient documentation

## 2017-07-12 DIAGNOSIS — Z590 Homelessness: Secondary | ICD-10-CM | POA: Insufficient documentation

## 2017-07-12 DIAGNOSIS — W19XXXA Unspecified fall, initial encounter: Secondary | ICD-10-CM

## 2017-07-12 DIAGNOSIS — S50312A Abrasion of left elbow, initial encounter: Secondary | ICD-10-CM | POA: Insufficient documentation

## 2017-07-12 DIAGNOSIS — F17210 Nicotine dependence, cigarettes, uncomplicated: Secondary | ICD-10-CM | POA: Insufficient documentation

## 2017-07-12 DIAGNOSIS — Z23 Encounter for immunization: Secondary | ICD-10-CM | POA: Insufficient documentation

## 2017-07-12 DIAGNOSIS — Y998 Other external cause status: Secondary | ICD-10-CM | POA: Insufficient documentation

## 2017-07-12 DIAGNOSIS — S80212A Abrasion, left knee, initial encounter: Secondary | ICD-10-CM | POA: Insufficient documentation

## 2017-07-12 DIAGNOSIS — Y9289 Other specified places as the place of occurrence of the external cause: Secondary | ICD-10-CM | POA: Insufficient documentation

## 2017-07-12 DIAGNOSIS — Y9301 Activity, walking, marching and hiking: Secondary | ICD-10-CM | POA: Insufficient documentation

## 2017-07-12 LAB — RAPID URINE DRUG SCREEN, HOSP PERFORMED
Amphetamines: NOT DETECTED
Benzodiazepines: NOT DETECTED
Cocaine: NOT DETECTED
Opiates: NOT DETECTED
TETRAHYDROCANNABINOL: NOT DETECTED

## 2017-07-12 LAB — COMPREHENSIVE METABOLIC PANEL
ALBUMIN: 4.3 g/dL (ref 3.5–5.0)
ALT: 10 U/L (ref 0–44)
AST: 23 U/L (ref 15–41)
Alkaline Phosphatase: 54 U/L (ref 38–126)
Anion gap: 13 (ref 5–15)
BUN: 23 mg/dL (ref 8–23)
CHLORIDE: 100 mmol/L (ref 98–111)
CO2: 25 mmol/L (ref 22–32)
CREATININE: 0.96 mg/dL (ref 0.61–1.24)
Calcium: 9.5 mg/dL (ref 8.9–10.3)
GFR calc Af Amer: >60 mL/min (ref >60)
GFR calc non Af Amer: >60 mL/min (ref >60)
GLUCOSE: 92 mg/dL (ref 70–99)
POTASSIUM: 3.6 mmol/L (ref 3.5–5.1)
Sodium: 138 mmol/L (ref 135–145)
Total Bilirubin: 0.7 mg/dL (ref 0.3–1.2)
Total Protein: 8.7 g/dL — ABNORMAL HIGH (ref 6.5–8.1)

## 2017-07-12 LAB — LIPASE, BLOOD: Lipase: 42 U/L (ref 11–51)

## 2017-07-12 LAB — CBC
HEMATOCRIT: 39.5 % (ref 39.0–52.0)
HEMOGLOBIN: 12.9 g/dL — AB (ref 13.0–17.0)
MCH: 29.3 pg (ref 26.0–34.0)
MCHC: 32.7 g/dL (ref 30.0–36.0)
MCV: 89.8 fL (ref 78.0–100.0)
Platelets: 127 10*3/uL — ABNORMAL LOW (ref 150–400)
RBC: 4.4 MIL/uL (ref 4.22–5.81)
RDW: 14.8 % (ref 11.5–15.5)
WBC: 5.2 10*3/uL (ref 4.0–10.5)

## 2017-07-12 LAB — URINALYSIS, ROUTINE W REFLEX MICROSCOPIC
BILIRUBIN URINE: NEGATIVE
GLUCOSE, UA: NEGATIVE mg/dL
HGB URINE DIPSTICK: NEGATIVE
KETONES UR: NEGATIVE mg/dL
Leukocytes, UA: NEGATIVE
Nitrite: NEGATIVE
Protein, ur: NEGATIVE mg/dL
Specific Gravity, Urine: 1.008 (ref 1.005–1.030)
pH: 5 (ref 5.0–8.0)

## 2017-07-12 LAB — I-STAT TROPONIN, ED: TROPONIN I, POC: 0.03 ng/mL (ref 0.00–0.08)

## 2017-07-12 LAB — CBG MONITORING, ED: GLUCOSE-CAPILLARY: 76 mg/dL (ref 70–99)

## 2017-07-12 LAB — ETHANOL: Alcohol, Ethyl (B): 170 mg/dL — ABNORMAL HIGH (ref <10)

## 2017-07-12 MED ORDER — TETANUS-DIPHTH-ACELL PERTUSSIS 5-2.5-18.5 LF-MCG/0.5 IM SUSP
0.5000 mL | Freq: Once | INTRAMUSCULAR | Status: AC
  Administered 2017-07-12: 0.5 mL via INTRAMUSCULAR
  Filled 2017-07-12: qty 0.5

## 2017-07-12 MED ORDER — SODIUM CHLORIDE 0.9 % IV BOLUS
1000.0000 mL | Freq: Once | INTRAVENOUS | Status: AC
  Administered 2017-07-12: 1000 mL via INTRAVENOUS

## 2017-07-12 NOTE — ED Triage Notes (Signed)
Pt was brought in via [facility]. Pt is homeless. Pt was downtown walking with walker in room and fell. Pt does appear to have some ETOH on board. Pt denies LOC and/or hitting head. Pt has some abrasions on left knee and left elbow. Pt has no other complaints.
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
## 2017-07-12 NOTE — ED Notes (Signed)
Pt has been cleaned and given a blue top and bottom scrub to change in as well as a brand new walker from hospital. Pt will remain out in lobby until Case management provided clothes from basement.

## 2017-07-12 NOTE — ED Notes (Signed)
Called lab to add orders on to what has already been sent down.

## 2017-07-12 NOTE — Progress Notes (Signed)
CSW spoke with RN regarding patient's need for a walker. Patient reportedly has been using a walker with non-functional brakes. CSW requested case management consult and notified case manager.   Patient is otherwise cleared for discharge. No other social work needs identified, CSW signing off.  Enid Cutterharlotte Shauntay Brunelli, LCSW-A Clinical Social Worker [phone]

## 2017-07-12 NOTE — ED Notes (Signed)
Left knee and left elbow have been cleaned and bandages applied.

## 2017-07-12 NOTE — Progress Notes (Signed)
Contacted [facility] for RW for home. Davionna Blacksher RN CCM Case Mgmt phone [phone] 
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
## 2018-04-20 IMAGING — CT CT HEAD W/O CM
3 of 8 series · 13 of 47 positions shown, 15 images · non-contrast
Comparison: None.

CLINICAL DATA: Trauma.

EXAM:
CT HEAD WITHOUT CONTRAST
CT CERVICAL SPINE WITHOUT CONTRAST
TECHNIQUE: Multidetector CT imaging of the head and cervical spine was
performed following the standard protocol without intravenous
contrast. Multiplanar CT image reconstructions of the cervical spine
were also generated.

[Series 5: coronal · coronal · 0.31mm/px · 3 of 78 slices shown]
[im 16/78  brain]
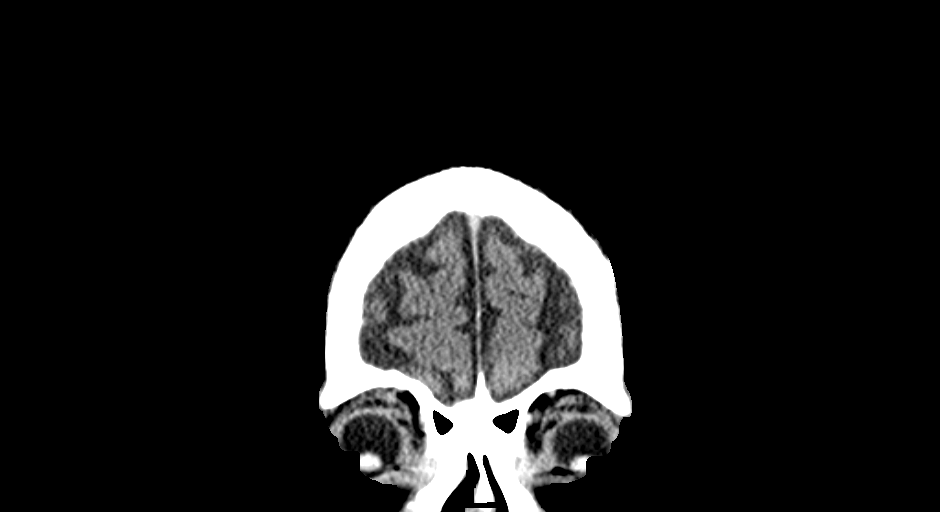
[im 31/78  brain]
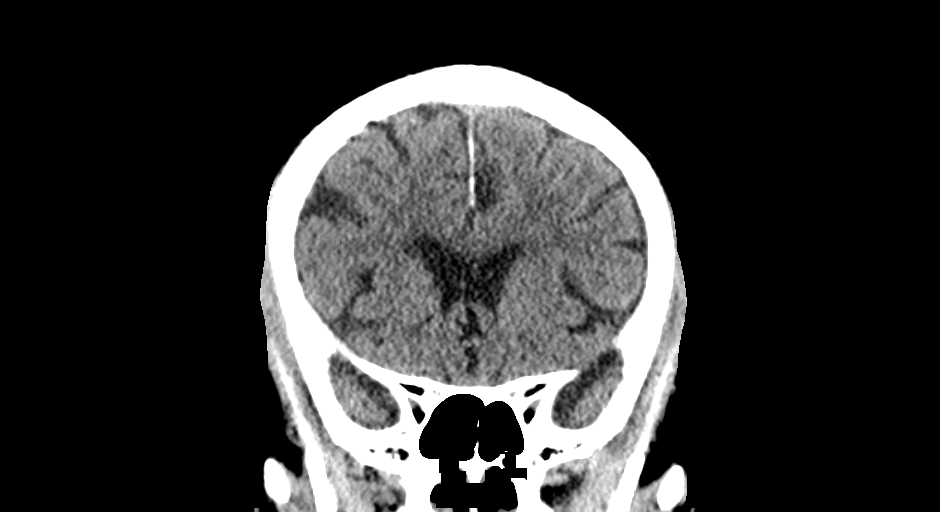
[im 47/78  brain]
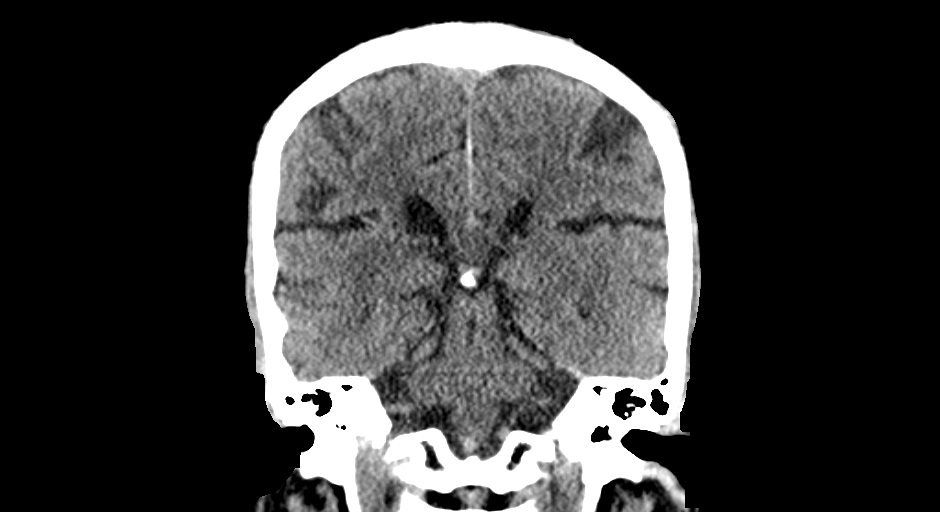

[Series 6: sagittal · sagittal · 0.31mm/px · 2 of 73 slices shown]
[im 25/73  brain]
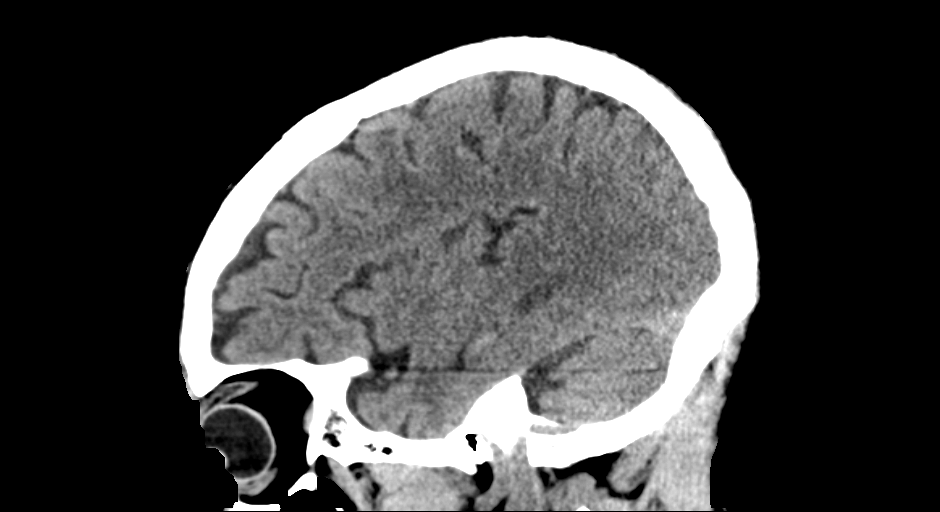
[im 49/73  brain]
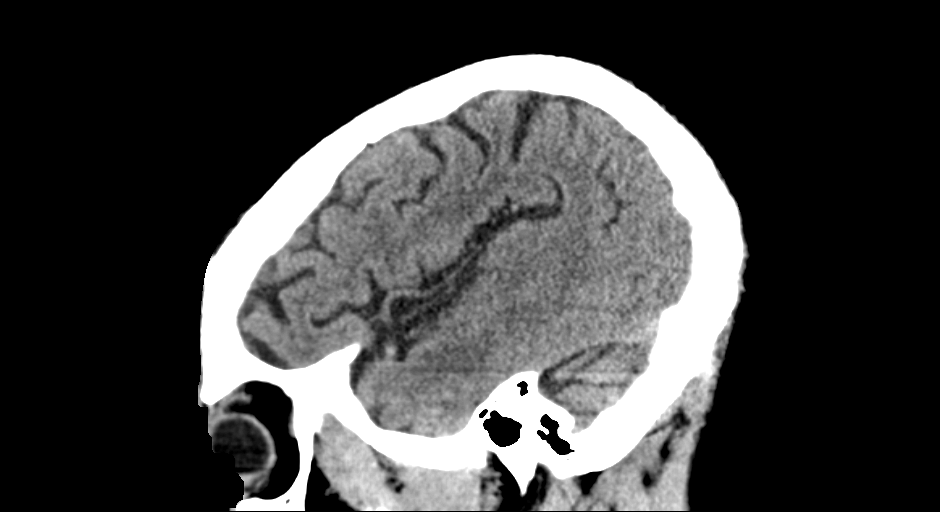

[Series 10: axial recon · axial · 0.23mm/px · z∈[+989,+1147]mm · 8 of 100 slices shown, 10 images]
[im 10/100  brain]
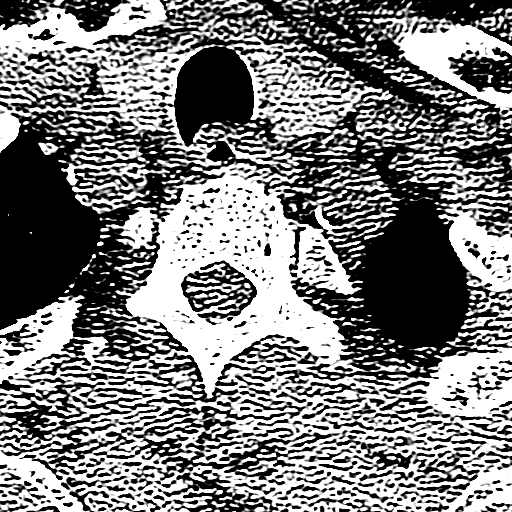
[im 10/100  bone]
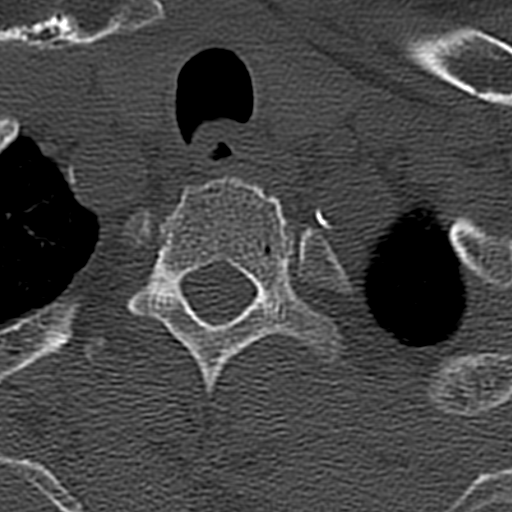
[im 20/100  brain]
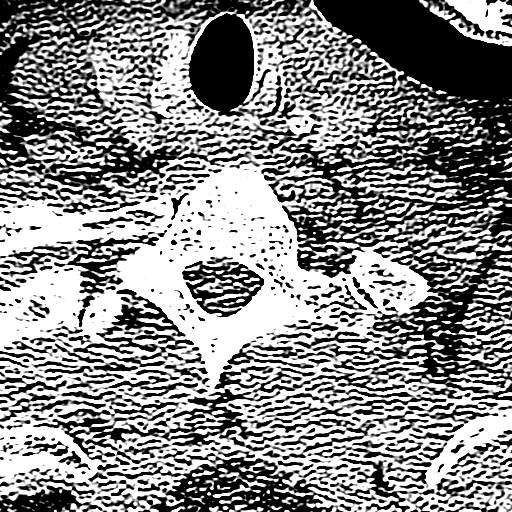
[im 30/100  brain]
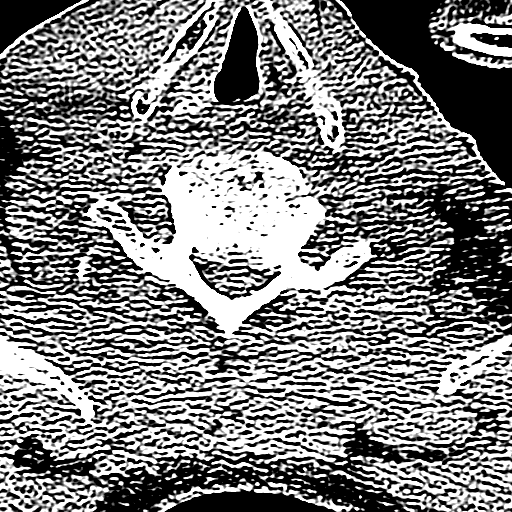
[im 40/100  brain]
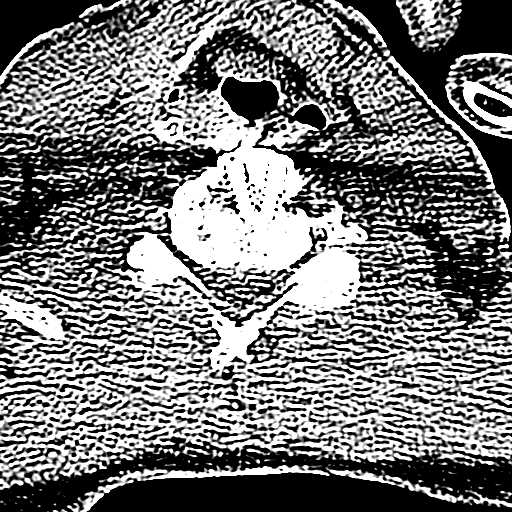
[im 60/100  brain]
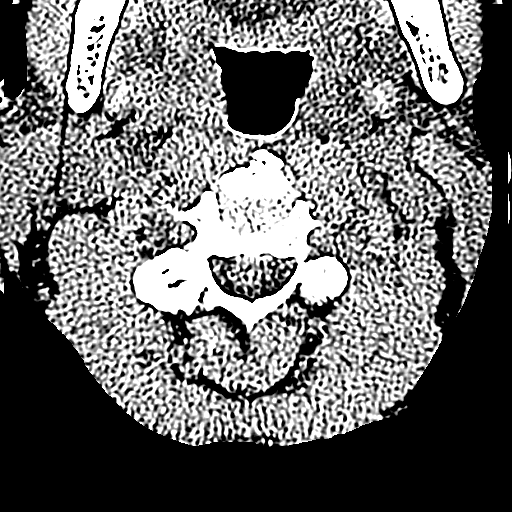
[im 60/100  bone]
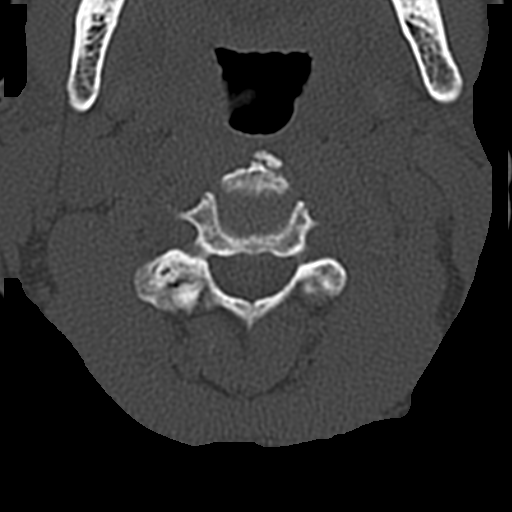
[im 70/100  brain]
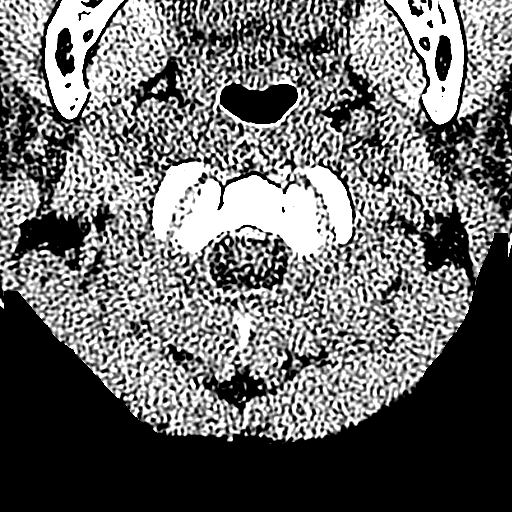
[im 80/100  brain]
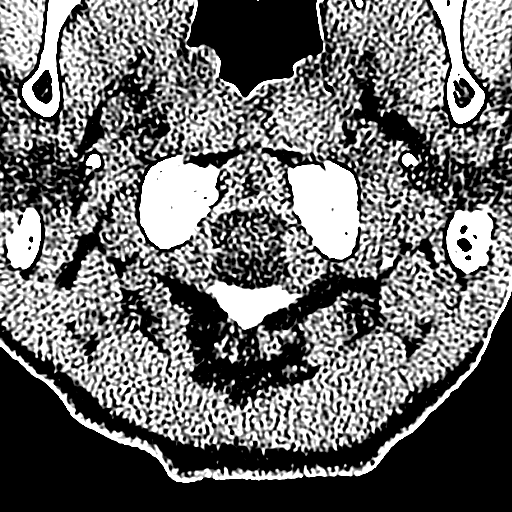
[im 90/100  brain]
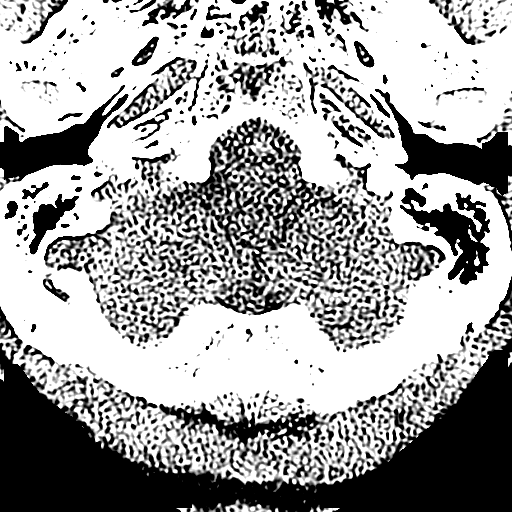

[13 of 47 positions shown; findings below may reference images not displayed]

FINDINGS: CT HEAD FINDINGS

Paranasal sinuses, mastoid air cell, and bones are normal.
Extracranial soft tissues are within normal limits. No subdural,
epidural, or subarachnoid hemorrhage. No mass, mass effect, or
midline shift. The cerebellum, brainstem, and basal cisterns are
normal. Calcifications are seen in the basal ganglia. An
age-indeterminate lacunar infarct is seen in the right caudate on
series 2, image 13. White matter changes are seen in the right
corona radiata on image 19. No acute cortical ischemia or cortical
infarct identified. Ventricles and sulci are unremarkable.

CT CERVICAL SPINE FINDINGS

There is reversal of normal lordosis centered at C4. Minimal
anterolisthesis of C7 versus T1, approximately 2 mm, is likely
degenerative with no identified fracture in this location. The
patient is status post fusion of the cervical spine from C3 through
C5 with anterior plate and screws. Multilevel degenerative changes
are seen with small posterior osteophytes and facet degenerative
changes.

Calcifications are seen in the bilateral carotid arteries.

No soft tissue abnormalities otherwise seen.

Mild narrowing of the cervical spine from C3 through C5 with an AP
diameter/ dimension of 10 mm.
IMPRESSION: 1. Age-indeterminate lacunar infarct in the right caudate and white
matter changes in the right corona radiata. No other acute
abnormalities.
2. Degenerative and postoperative changes in the cervical spine with
no fracture identified.

## 2018-04-29 NOTE — Progress Notes (Signed)
Patient temperature assessed for temperature, medical and medicinal needs. Patient reported no additional needs.  Carlyle Basques RN MSN

## 2018-05-11 NOTE — Progress Notes (Signed)
COVID Hotel Screening performed. Temperature, PHQ-9, and need for medical care and medications assessed. No additional needs assessed.  Hafsa Lohn RN MSN 

## 2018-05-22 IMAGING — CR DG HIP (WITH OR WITHOUT PELVIS) 2-3V*R*
3 series · 3 of 3 positions shown · non-contrast
Comparison: None.

CLINICAL DATA: Fall 10 days ago with right hip pain

EXAM:
DG HIP (WITH OR WITHOUT PELVIS) 2-3V RIGHT

[x pelvis]
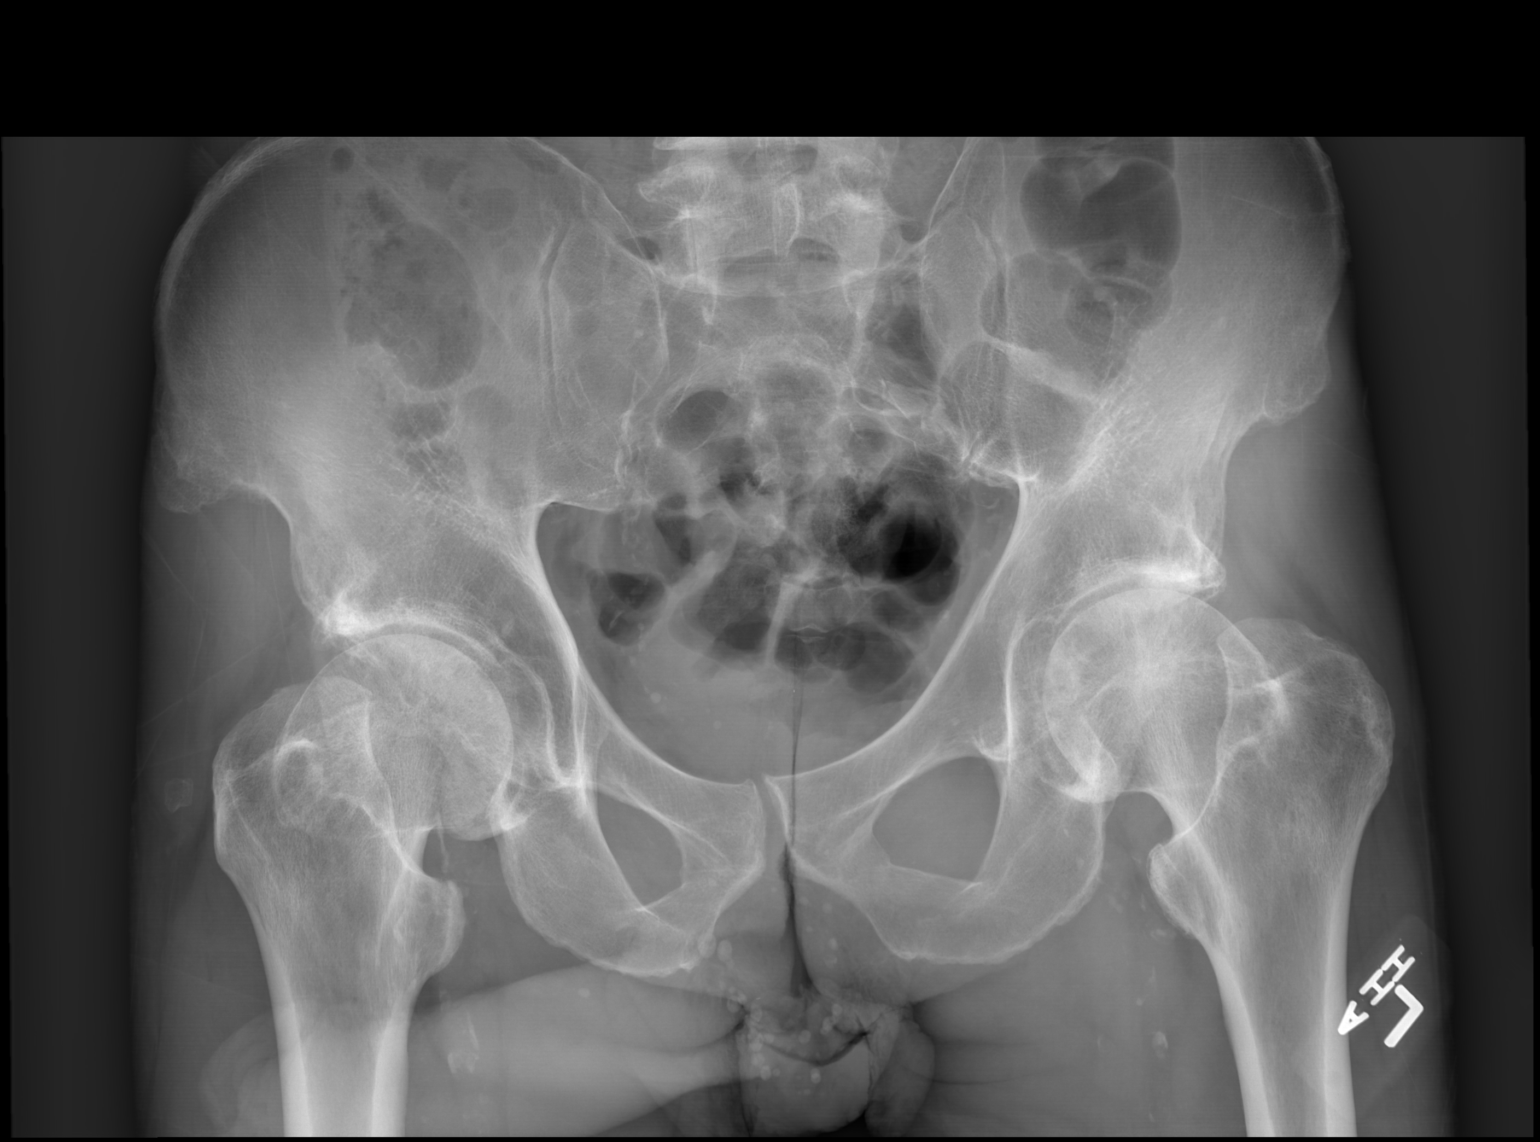

[x hip ap right]
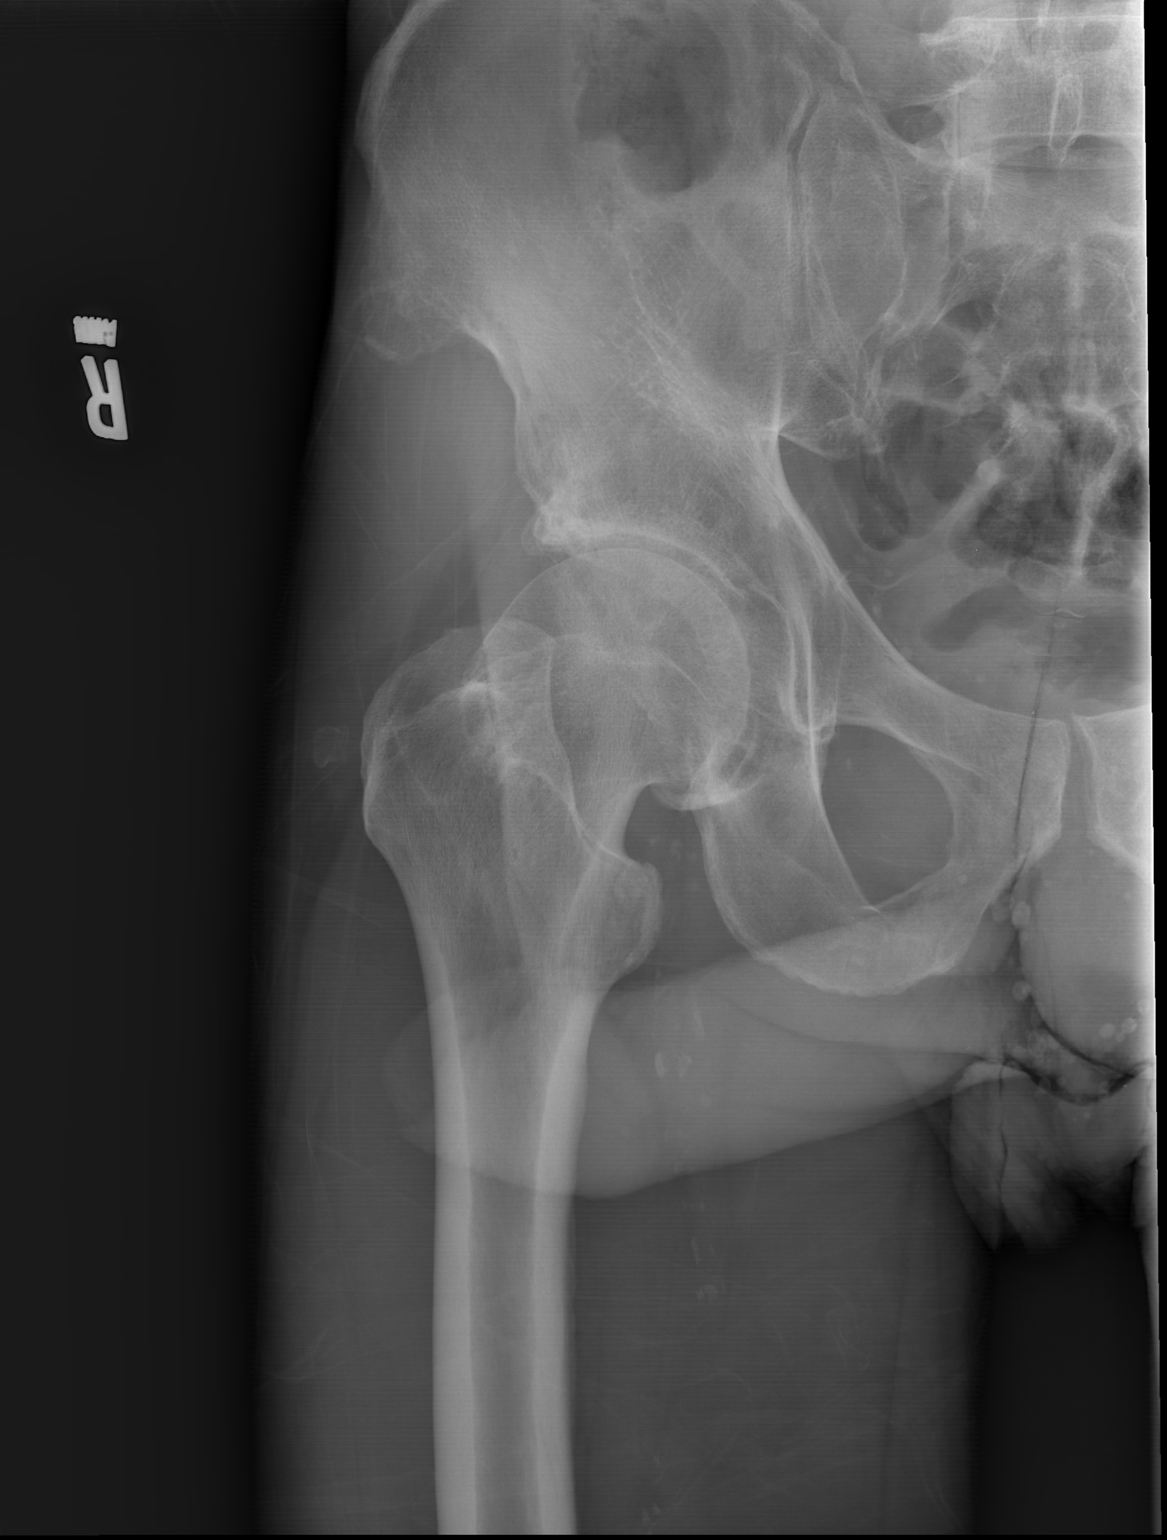

[x hip lat right]
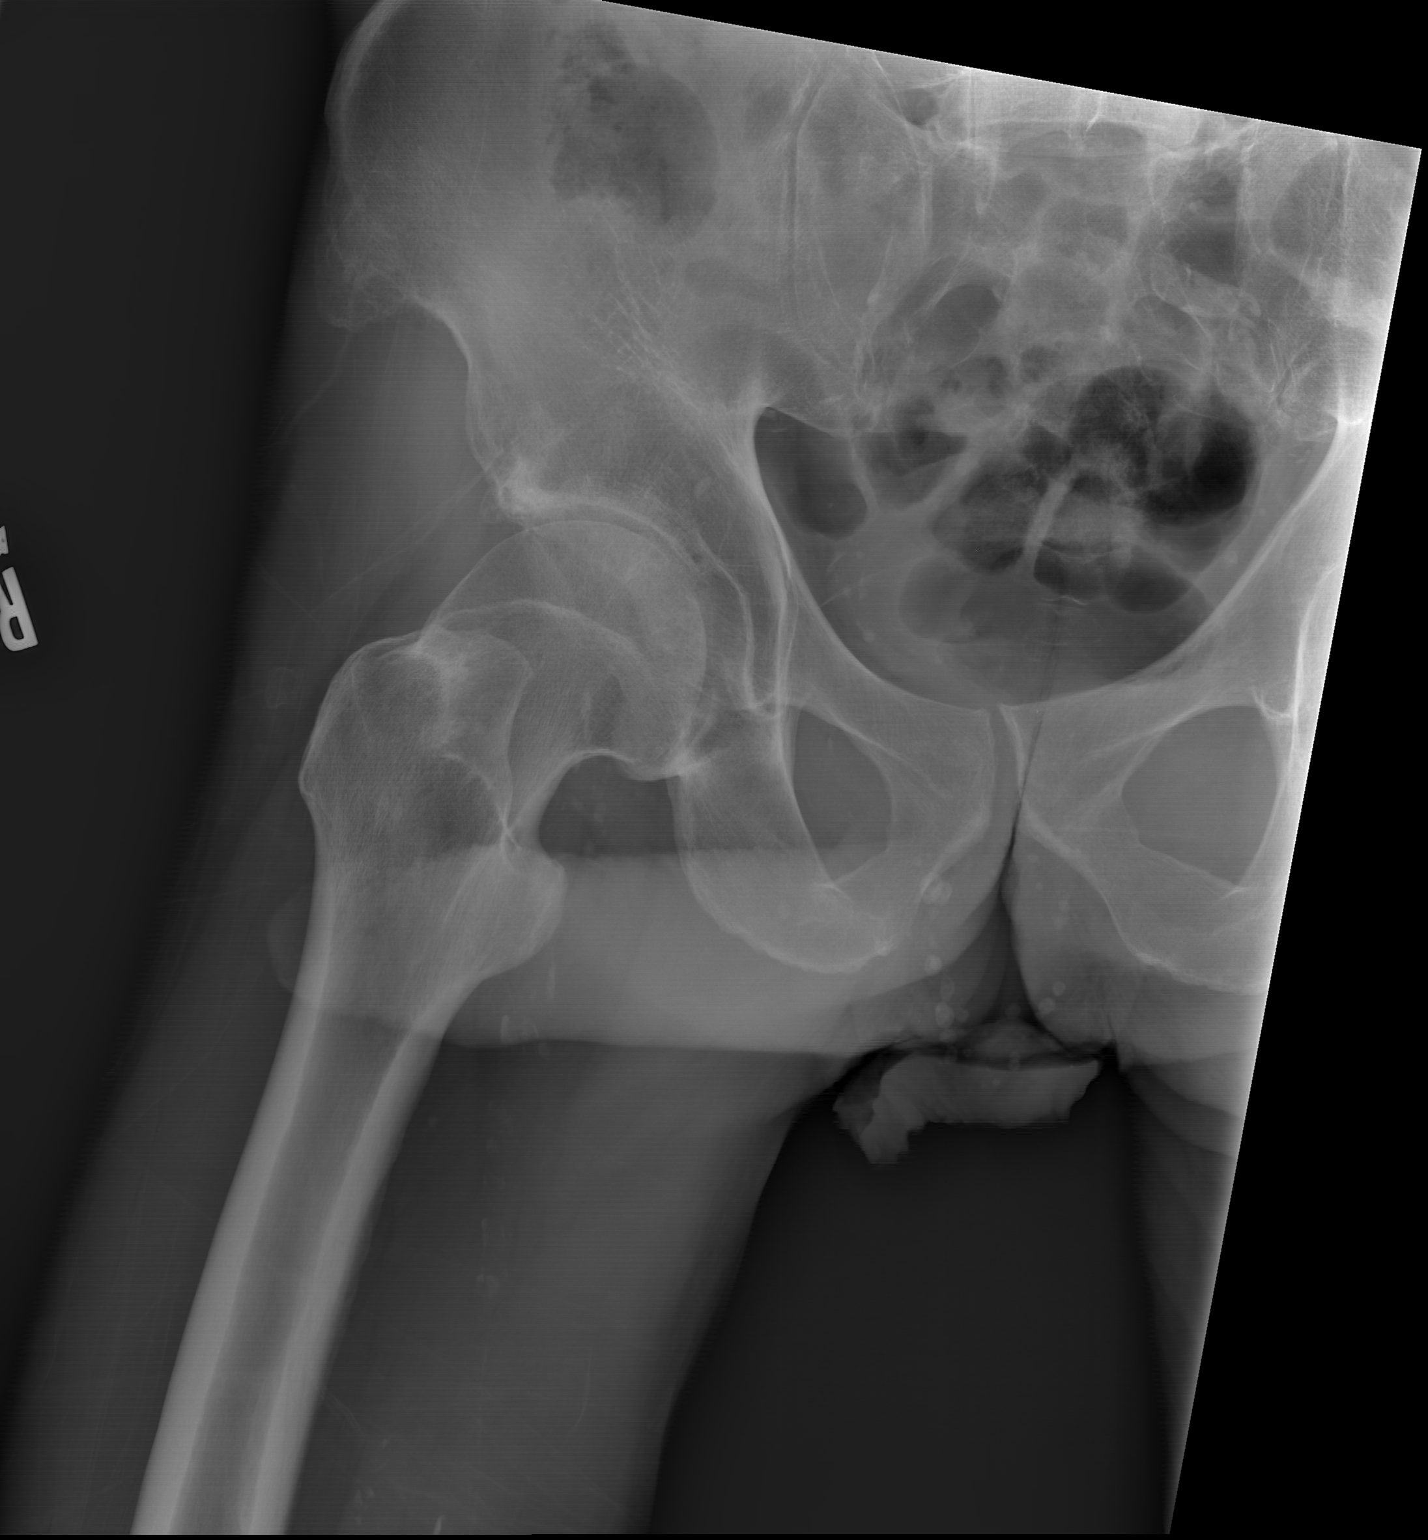

[3 of 3 positions shown; findings below may reference images not displayed]

FINDINGS: Frontal pelvis shows no fracture. SI joints and symphysis pubis are
unremarkable. No inferior or superior pubic ramus fracture evident.

AP and frog-leg lateral views of the right hip show no definite
fracture although assessment is limited by positioning.
IMPRESSION: No definite right femoral neck fracture although study limited by
patient positioning. CT or MRI may prove helpful to further evaluate
if there is high clinical suspicion for femoral neck fracture.
